# Patient Record
Sex: Male | Born: 1975 | Race: White | Hispanic: No | Marital: Married | Smoking: Never smoker
Health system: Southern US, Community
[De-identification: ages and names within clinical notes are randomized; demographics above are authoritative.]

## PROBLEM LIST (undated history)

## (undated) DIAGNOSIS — E119 Type 2 diabetes mellitus without complications: Secondary | ICD-10-CM

## (undated) DIAGNOSIS — H539 Unspecified visual disturbance: Secondary | ICD-10-CM

## (undated) DIAGNOSIS — L600 Ingrowing nail: Secondary | ICD-10-CM

## (undated) HISTORY — DX: Ingrowing nail: L60.0

## (undated) HISTORY — DX: Unspecified visual disturbance: H53.9

## (undated) HISTORY — DX: Type 2 diabetes mellitus without complications: E11.9

---
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
## 1997-07-26 HISTORY — PX: NASAL SEPTUM SURGERY: SHX37

## 2015-10-25 DIAGNOSIS — H539 Unspecified visual disturbance: Secondary | ICD-10-CM

## 2015-10-25 HISTORY — DX: Unspecified visual disturbance: H53.9

## 2015-12-02 ENCOUNTER — Ambulatory Visit (INDEPENDENT_AMBULATORY_CARE_PROVIDER_SITE_OTHER): Payer: Managed Care, Other (non HMO) | Admitting: Family Medicine

## 2015-12-02 ENCOUNTER — Telehealth: Payer: Self-pay | Admitting: Family Medicine

## 2015-12-02 ENCOUNTER — Encounter: Payer: Self-pay | Admitting: Family Medicine

## 2015-12-02 VITALS — BP 114/76 | HR 71 | Temp 98.5°F | Ht 71.0 in | Wt 236.1 lb

## 2015-12-02 DIAGNOSIS — H539 Unspecified visual disturbance: Secondary | ICD-10-CM | POA: Diagnosis not present

## 2015-12-02 DIAGNOSIS — R358 Other polyuria: Secondary | ICD-10-CM | POA: Diagnosis not present

## 2015-12-02 DIAGNOSIS — R3589 Other polyuria: Secondary | ICD-10-CM

## 2015-12-02 DIAGNOSIS — Z7189 Other specified counseling: Secondary | ICD-10-CM | POA: Diagnosis not present

## 2015-12-02 DIAGNOSIS — N529 Male erectile dysfunction, unspecified: Secondary | ICD-10-CM | POA: Insufficient documentation

## 2015-12-02 DIAGNOSIS — Z0000 Encounter for general adult medical examination without abnormal findings: Secondary | ICD-10-CM | POA: Diagnosis not present

## 2015-12-02 DIAGNOSIS — Z7689 Persons encountering health services in other specified circumstances: Secondary | ICD-10-CM

## 2015-12-02 DIAGNOSIS — R208 Other disturbances of skin sensation: Secondary | ICD-10-CM

## 2015-12-02 DIAGNOSIS — N528 Other male erectile dysfunction: Secondary | ICD-10-CM

## 2015-12-02 DIAGNOSIS — R200 Anesthesia of skin: Secondary | ICD-10-CM | POA: Insufficient documentation

## 2015-12-02 DIAGNOSIS — R631 Polydipsia: Secondary | ICD-10-CM | POA: Insufficient documentation

## 2015-12-02 HISTORY — DX: Male erectile dysfunction, unspecified: N52.9

## 2015-12-02 LAB — CBC WITH DIFFERENTIAL/PLATELET
BASOS PCT: 1.5 % (ref 0.0–3.0)
Basophils Absolute: 0.1 10*3/uL (ref 0.0–0.1)
EOS ABS: 0.2 10*3/uL (ref 0.0–0.7)
EOS PCT: 2.3 % (ref 0.0–5.0)
HEMATOCRIT: 46.8 % (ref 39.0–52.0)
Hemoglobin: 16 g/dL (ref 13.0–17.0)
Lymphocytes Relative: 29.6 % (ref 12.0–46.0)
Lymphs Abs: 2.1 10*3/uL (ref 0.7–4.0)
MCHC: 34.3 g/dL (ref 30.0–36.0)
MCV: 84.8 fl (ref 78.0–100.0)
MONO ABS: 0.5 10*3/uL (ref 0.1–1.0)
Monocytes Relative: 7 % (ref 3.0–12.0)
NEUTROS ABS: 4.3 10*3/uL (ref 1.4–7.7)
Neutrophils Relative %: 59.6 % (ref 43.0–77.0)
PLATELETS: 203 10*3/uL (ref 150.0–400.0)
RBC: 5.51 Mil/uL (ref 4.22–5.81)
RDW: 13.2 % (ref 11.5–15.5)
WBC: 7.2 10*3/uL (ref 4.0–10.5)

## 2015-12-02 LAB — COMPREHENSIVE METABOLIC PANEL
ALBUMIN: 4.6 g/dL (ref 3.5–5.2)
ALT: 23 U/L (ref 0–53)
AST: 14 U/L (ref 0–37)
Alkaline Phosphatase: 48 U/L (ref 39–117)
BUN: 12 mg/dL (ref 6–23)
CALCIUM: 9.8 mg/dL (ref 8.4–10.5)
CHLORIDE: 102 meq/L (ref 96–112)
CO2: 26 meq/L (ref 19–32)
CREATININE: 0.86 mg/dL (ref 0.40–1.50)
GFR: 103.78 mL/min (ref >60.00)
Glucose, Bld: 309 mg/dL — ABNORMAL HIGH (ref 70–99)
POTASSIUM: 4.8 meq/L (ref 3.5–5.1)
Sodium: 137 mEq/L (ref 135–145)
Total Bilirubin: 0.8 mg/dL (ref 0.2–1.2)
Total Protein: 7.4 g/dL (ref 6.0–8.3)

## 2015-12-02 LAB — HEMOGLOBIN A1C: HEMOGLOBIN A1C: 11 % — AB (ref 4.6–6.5)

## 2015-12-02 NOTE — Patient Instructions (Addendum)
Diabetic Retinopathy Diabetic retinopathy is a disease of the light-sensitive membrane at the back of the eye (retina). It is a complication of diabetes and a common cause of blindness. Early detection of the disease is key to keeping your eyes healthy.  CAUSES  Diabetic retinopathy is caused by blood sugar (glucose) levels that are too high over an extended period of time. High blood sugars cause damage to the small blood vessels of the retina, allowing blood to leak through the vessel walls. This causes visual impairment and eventually blindness. RISK FACTORS  High blood pressure.  Having diabetes for a long time.  Having poorly controlled blood sugars. SIGNS AND SYMPTOMS  In the early stages of diabetic retinopathy, there are often no symptoms. As the condition advances, symptoms may include:  Blurred vision. This is usually caused by a swelling due to abnormal blood glucose levels. The blurriness may go away when blood glucose levels return to normal.  Moving specks or dark spots (floaters) in your vision. These can be caused by a small retinal hemorrhage. A hemorrhage is bleeding from blood vessels.  Missing parts of your field of vision, such as things at the side. This can be caused by larger retinal hemorrhages.  Difficulty reading books or signs.  Double vision.  Pain in one or both eyes.  Feeling pressure in one or both eyes.  Trouble seeing straight lines. Straight lines do not look straight.  Redness of the eyes that does not go away. DIAGNOSIS  Your eye care specialist can detect changes in the blood vessels of your eye by putting drops in your eyes that enlarge (dilate) your pupils. This allows your eye care specialist to get a good look at your retina to see if there are any changes that have occurred as a result of your diabetes. You should have your eyes examined once a year. TREATMENT  Your eye care specialist may use a special laser beam to seal the blood vessels  of the retina and stop them from leaking. Early detection and treatment are important so that further damage to your eyes can be prevented. In addition, managing your blood sugars and keeping them in the target range can slow the progress of the disease. HOME CARE INSTRUCTIONS   Keep your blood pressure within your target range.  Keep your blood glucose levels within your target range.  Follow your health care provider's instructions regarding diet and other means for controlling your blood glucose levels.  Check your blood levels for glucose as recommended by your health care provider.  Keep regular appointments with your eye specialist. An eye specialist can usually see diabetic retinopathy developing long before it starts causing problems. In many cases, it can be treated to prevent complications from occurring. If you have diabetes, you should have your eyes checked at least every year. Your risk of retinopathy increases the longer you have the disease.  If you smoke, quit. Ask your health care provider for help if needed. Smoking can make retinopathy worse. SEEK MEDICAL CARE IF:   You notice gradual blurring or other changes in your vision over time.  You notice that your glasses or contact lenses do not make things look as sharp as they once did.  You have trouble reading or seeing details at a distance with either eye.  You notice a sudden change in your vision or notice that parts of your field of vision appear missing or hazy.  You suddenly see moving specks or dark spots   in the field of vision of either eye.  You have sudden partial loss of vision in either eye.   This information is not intended to replace advice given to you by your health care provider. Make sure you discuss any questions you have with your health care provider.   Document Released: 07/09/2000 Document Revised: 05/02/2013 Document Reviewed: 01/01/2013 Elsevier Interactive Patient Education [employer]2016 [employer]  We will call you with lab results.  Depending on results is when we will need to see you.  Physical with fasting labs prior, within 3 months.

## 2015-12-02 NOTE — Progress Notes (Signed)
Patient ID: Blake Davis, male   DOB: [birth year], 40 y.o.   MRN: [id number]      Patient ID: Blake Davis, male  DOB: [birth year], 40 y.o.   MRN: [id number]  Subjective:  Blake Davis is a 40 y.o. male present for establishment of care. All past medical history, surgical history, allergies, family history, immunizations, medications and social history were obtained and entered in the electronic medical record today. All recent labs, ED visits and hospitalizations within the last year were reviewed.  Patient was seen by his ophthalmologist in April and was told he needs to be screened for diabetes. He was initially seen at the ophthalmologist for blurry vision. He states the ophthalmologist saw spots on the back of his eyes that could be from diabetes. Patient has not been followed routinely by primary care physician in the past. He states that the blurry vision had been worsening over the last 6 months. He also endorses polydipsia, polyuria, erectile dysfunction, "numbness" in bilateral feet, and a nonhealing "wound" on his right thumb. All of the above symptoms have been present between 6 months and 1 year.  Health maintenance:  Colonoscopy: No family history, routine screening at age 18 Immunizations: All immunizations will need up-to-date. Infectious disease screening: No known screenings. PSA: Never, prostate cancer in paternal grandfather and a "late age". Assistive device: None Oxygen KGM:WNUU Patient has a Dental home. Hospitalizations/ED visits: None Ophthalmologist: Dr. Manon Davis, [facility]  Past Medical History  Diagnosis Date  . Visual changes 10/2015    Diabetic changes   No Known Allergies Past Surgical History  Procedure Laterality Date  . Nasal septum surgery  1999    deviated  . Foot surgery  1988    mole/nevus   Family History  Problem Relation Age of Onset  . Diabetes Mother   . Heart disease Mother   . Heart attack Mother   .  Alcohol abuse Paternal Uncle   . Dementia Paternal Grandmother   . Diabetes Paternal Grandfather   . Stroke Paternal Grandfather   . Cancer Paternal Grandfather   . Prostate cancer Paternal Grandfather    Social History   Social History  . Marital Status: Married    Spouse Name: Blake Davis  . Number of Children: 0  . Years of Education: BA   Occupational History  . Independent Contractor Other    [employer]   Social History Main Topics  . Smoking status: Never Smoker   . Smokeless tobacco: Never Used  . Alcohol Use: No  . Drug Use: No  . Sexual Activity: Yes    Birth Control/ Protection: None     Comment: male partner   Other Topics Concern  . Not on file   Social History Narrative   Patient lives at home with wife (Mel). No children.   Has two pets.    Bachelors degree, works as a Visual merchandiser   Drinks caffeinated beverages   Wears his seatbelt   Smoke detector in home   Feels safe in his relationships     ROS: Negative, with the exception of above mentioned in HPI  Objective: BP 114/76 mmHg  Pulse 71  Temp(Src) 98.5 F (36.9 C) (Oral)  Ht  (1.803 m)  Wt 236 lb 1.9 oz (107.103 kg)  BMI 32.95 kg/m2  SpO2 98% Gen: Afebrile. No acute distress. Nontoxic in appearance, well-developed, well-nourished, male, pleasant HENT: AT. [place]. Left TM visualized and normal in appearance, normal external  auditory canal. Right tympanic membrane unable to be visualized secondary to cerumen impaction. MMM, no oral lesions Eyes:Pupils Equal Round Reactive to light, Extraocular movements intact,  Conjunctiva without redness, discharge or icterus. Neck/lymp/endocrine: Supple, no lymphadenopathy, no thyromegaly CV: RRR no murmur appreciated, no edema, +2/4 P posterior tibialis pulses Chest: CTAB, no wheeze, rhonchi or crackles.  Abd: Soft. Round. NTND. BS present. No Masses palpated. Skin: Right thumb with scaly skin patch, no erythema, no  drainage. Neuro/Msk:  Normal gait. PERLA. EOMi. Alert. Oriented x3.   Psych: Normal affect, dress and demeanor. Normal speech. Normal thought content and judgment.   Assessment/plan: Blake Davis is a 40 y.o. male present for establishment of care, with signs and symptoms of new-onset diabetes. Visual changes/erectile dysfunction/polyuria/polydipsia/numbness in feet - Patient likely with diabetes. Discussed changes in small vessels with diabetes can cause many of the symptoms that he is experiencing currently. Discussed with him if workup is positive we'll need to see him back soon in order to start management. If workup is negative, will see him back at his convenience to manage above symptoms. - CBC w/Diff - Comprehensive metabolic panel - HgB A1c  Healthcare maintenance Colonoscopy: No family history, routine screening at age 40 Immunizations: All immunizations will need up-to-date. Infectious disease screening: No known screenings. PSA: Never, prostate cancer in paternal grandfather and a "late age".  Follow-up depending upon lab results. Patient encouraged that if he does prove to be a diabetic will need to see back very quickly. Otherwise patient can schedule management of symptoms as desired, or physical within 3 months.  Greater than 45 minutes was spent with patient, greater than 50% of that time was spent face-to-face with patient counseling and coordinating care.   Electronically signed by: Felix Pacinienee Kuneff, DO [facility]

## 2015-12-02 NOTE — Telephone Encounter (Signed)
Patient is going out of town. Please call his wife's cell [phone] when you have his blood results.

## 2015-12-02 NOTE — Telephone Encounter (Signed)
Patient's lab work today indicate he is a new diabetic with an A1c of 11%. Discussed this over the phone with him and his wife Mel. Strongly encouraged him to follow-up immediately for medication start and diabetes education. Patient is going out of town Thursday afternoon, would like to wait until next week to start regimen. Discussed with him diabetes management needs to start as soon as possible with and A1c of this caliber. Patient amendable to come in Thursday morning before leaving for vacation. Patient was scheduled for Thursday, May 11 at 8:15 in a 30 minute slot, by this provider,  for diabetes education and start of oral medications. Referral will also be placed to diabetes formal education classes at that appointment, which he can set up at his convenience.

## 2015-12-04 ENCOUNTER — Encounter: Payer: Self-pay | Admitting: Family Medicine

## 2015-12-04 ENCOUNTER — Ambulatory Visit (INDEPENDENT_AMBULATORY_CARE_PROVIDER_SITE_OTHER): Payer: Managed Care, Other (non HMO) | Admitting: Family Medicine

## 2015-12-04 VITALS — BP 119/78 | HR 66 | Temp 98.0°F | Resp 20 | Ht 71.0 in | Wt 236.5 lb

## 2015-12-04 DIAGNOSIS — Z6833 Body mass index (BMI) 33.0-33.9, adult: Secondary | ICD-10-CM

## 2015-12-04 DIAGNOSIS — E119 Type 2 diabetes mellitus without complications: Secondary | ICD-10-CM

## 2015-12-04 DIAGNOSIS — E083211 Diabetes mellitus due to underlying condition with mild nonproliferative diabetic retinopathy with macular edema, right eye: Secondary | ICD-10-CM | POA: Insufficient documentation

## 2015-12-04 DIAGNOSIS — E083292 Diabetes mellitus due to underlying condition with mild nonproliferative diabetic retinopathy without macular edema, left eye: Secondary | ICD-10-CM

## 2015-12-04 DIAGNOSIS — Z6835 Body mass index (BMI) 35.0-35.9, adult: Secondary | ICD-10-CM | POA: Insufficient documentation

## 2015-12-04 DIAGNOSIS — B369 Superficial mycosis, unspecified: Secondary | ICD-10-CM

## 2015-12-04 LAB — MICROALBUMIN / CREATININE URINE RATIO
CREATININE, U: 157 mg/dL
MICROALB/CREAT RATIO: 0.6 mg/g (ref 0.0–30.0)
Microalb, Ur: 1 mg/dL (ref 0.0–1.9)

## 2015-12-04 MED ORDER — CLOTRIMAZOLE 1 % EX CREA: 1.0000 "application " | TOPICAL_CREAM | Freq: Two times a day (BID) | CUTANEOUS | Status: DC

## 2015-12-04 MED ORDER — METFORMIN HCL 500 MG PO TABS: ORAL_TABLET | ORAL | Status: DC

## 2015-12-04 MED ORDER — BLOOD GLUCOSE METER KIT: PACK | Status: DC

## 2015-12-04 NOTE — Patient Instructions (Signed)
Basic Carbohydrate Counting for Diabetes Mellitus Carbohydrate counting is a method for keeping track of the amount of carbohydrates you eat. Eating carbohydrates naturally increases the level of sugar (glucose) in your blood, so it is important for you to know the amount that is okay for you to have in every meal. Carbohydrate counting helps keep the level of glucose in your blood within normal limits. The amount of carbohydrates allowed is different for every person. A dietitian can help you calculate the amount that is right for you. Once you know the amount of carbohydrates you can have, you can count the carbohydrates in the foods you want to eat. Carbohydrates are found in the following foods:  Grains, such as breads and cereals.  Dried beans and soy products.  Starchy vegetables, such as potatoes, peas, and corn.  Fruit and fruit juices.  Milk and yogurt.  Sweets and snack foods, such as cake, cookies, candy, chips, soft drinks, and fruit drinks. CARBOHYDRATE COUNTING There are two ways to count the carbohydrates in your food. You can use either of the methods or a combination of both. Reading the "Nutrition Facts" on Packaged Food The "Nutrition Facts" is an area that is included on the labels of almost all packaged food and beverages in the [place]. It includes the serving size of that food or beverage and information about the nutrients in each serving of the food, including the grams (g) of carbohydrate per serving.  Decide the number of servings of this food or beverage that you will be able to eat or drink. Multiply that number of servings by the number of grams of carbohydrate that is listed on the label for that serving. The total will be the amount of carbohydrates you will be having when you eat or drink this food or beverage. Learning Standard Serving Sizes of Food When you eat food that is not packaged or does not include "Nutrition Facts" on the label, you need to  measure the servings in order to count the amount of carbohydrates.A serving of most carbohydrate-rich foods contains about 15 g of carbohydrates. The following list includes serving sizes of carbohydrate-rich foods that provide 15 g ofcarbohydrate per serving:   1 slice of bread (1 oz) or 1 six-inch tortilla.    of a hamburger bun or English muffin.  4-6 crackers.   cup unsweetened dry cereal.    cup hot cereal.   cup rice or pasta.    cup mashed potatoes or  of a large baked potato.  1 cup fresh fruit or one small piece of fruit.    cup canned or frozen fruit or fruit juice.  1 cup milk.   cup plain fat-free yogurt or yogurt sweetened with artificial sweeteners.   cup cooked dried beans or starchy vegetable, such as peas, corn, or potatoes.  Decide the number of standard-size servings that you will eat. Multiply that number of servings by 15 (the grams of carbohydrates in that serving). For example, if you eat 2 cups of strawberries, you will have eaten 2 servings and 30 g of carbohydrates (2 servings x 15 g = 30 g). For foods such as soups and casseroles, in which more than one food is mixed in, you will need to count the carbohydrates in each food that is included. EXAMPLE OF CARBOHYDRATE COUNTING Sample Dinner  3 oz chicken breast.   cup of brown rice.   cup of corn.  1 cup milk.   1 cup strawberries with   sugar-free whipped topping.  Carbohydrate Calculation Step 1: Identify the foods that contain carbohydrates:   Rice.   Corn.   Milk.   Strawberries. Step 2:Calculate the number of servings eaten of each:   2 servings of rice.   1 serving of corn.   1 serving of milk.   1 serving of strawberries. Step 3: Multiply each of those number of servings by 15 g:   2 servings of rice x 15 g = 30 g.   1 serving of corn x 15 g = 15 g.   1 serving of milk x 15 g = 15 g.   1 serving of strawberries x 15 g = 15 g. Step 4: Add  together all of the amounts to find the total grams of carbohydrates eaten: 30 g + 15 g + 15 g + 15 g = 75 g.   This information is not intended to replace advice given to you by your health care provider. Make sure you discuss any questions you have with your health care provider.   Document Released: 07/12/2005 Document Revised: 08/02/2014 Document Reviewed: 06/08/2013 Elsevier Interactive Patient Education 2016 [employer] Inc[employer] Diabetes and Exercise Exercising regularly is important. It is not just about losing weight. It has many health benefits, such as:  Improving your overall fitness, flexibility, and endurance.  Increasing your bone density.  Helping with weight control.  Decreasing your body fat.  Increasing your muscle strength.  Reducing stress and tension.  Improving your overall health. People with diabetes who exercise gain additional benefits because exercise:  Reduces appetite.  Improves the body's use of blood sugar (glucose).  Helps lower or control blood glucose.  Decreases blood pressure.  Helps control blood lipids (such as cholesterol and triglycerides).  Improves the body's use of the hormone insulin by:  Increasing the body's insulin sensitivity.  Reducing the body's insulin needs.  Decreases the risk for heart disease because exercising:  Lowers cholesterol and triglycerides levels.  Increases the levels of good cholesterol (such as high-density lipoproteins [HDL]) in the body.  Lowers blood glucose levels. YOUR ACTIVITY PLAN  Choose an activity that you enjoy, and set realistic goals. To exercise safely, you should begin practicing any new physical activity slowly, and gradually increase the intensity of the exercise over time. Your health care provider or diabetes educator can help create an activity plan that works for you. General recommendations include:  Encouraging children to engage in at least 60 minutes of physical activity each  day.  Stretching and performing strength training exercises, such as yoga or weight lifting, at least 2 times per week.  Performing a total of at least 150 minutes of moderate-intensity exercise each week, such as brisk walking or water aerobics.  Exercising at least 3 days per week, making sure you allow no more than 2 consecutive days to pass without exercising.  Avoiding long periods of inactivity (90 minutes or more). When you have to spend an extended period of time sitting down, take frequent breaks to walk or stretch. RECOMMENDATIONS FOR EXERCISING WITH TYPE 1 OR TYPE 2 DIABETES   Check your blood glucose before exercising. If blood glucose levels are greater than 240 mg/dL, check for urine ketones. Do not exercise if ketones are present.  Avoid injecting insulin into areas of the body that are going to be exercised. For example, avoid injecting insulin into:  The arms when playing tennis.  The legs when jogging.  Keep a record of:  Food intake before   intake before and after you exercise.  Expected peak times of insulin action.  Blood glucose levels before and after you exercise.  The type and amount of exercise you have done.  Review your records with your health care provider. Your health care provider will help you to develop guidelines for adjusting food intake and insulin amounts before and after exercising.  If you take insulin or oral hypoglycemic agents, watch for signs and symptoms of hypoglycemia. They include:  Dizziness.  Shaking.  Sweating.  Chills.  Confusion.  Drink plenty of water while you exercise to prevent dehydration or heat stroke. Body water is lost during exercise and must be replaced.  Talk to your health care provider before starting an exercise program to make sure it is safe for you. Remember, almost any type of activity is better than none.   This information is not intended to replace advice given to you by your health care provider. Make sure you  discuss any questions you have with your health care provider.   Document Released: 10/02/2003 Document Revised: 11/26/2014 Document Reviewed: 12/19/2012 Elsevier Interactive Patient Education 2016 [employer] Inc.  Diabetes and Foot Care Diabetes may cause you to have problems because of poor blood supply (circulation) to your feet and legs. This may cause the skin on your feet to become thinner, break easier, and heal more slowly. Your skin may become dry, and the skin may peel and crack. You may also have nerve damage in your legs and feet causing decreased feeling in them. You may not notice minor injuries to your feet that could lead to infections or more serious problems. Taking care of your feet is one of the most important things you can do for yourself.  HOME CARE INSTRUCTIONS  Wear shoes at all times, even in the house. Do not go barefoot. Bare feet are easily injured.  Check your feet daily for blisters, cuts, and redness. If you cannot see the bottom of your feet, use a mirror or ask someone for help.  Wash your feet with warm water (do not use hot water) and mild soap. Then pat your feet and the areas between your toes until they are completely dry. Do not soak your feet as this can dry your skin.  Apply a moisturizing lotion or petroleum jelly (that does not contain alcohol and is unscented) to the skin on your feet and to dry, brittle toenails. Do not apply lotion between your toes.  Trim your toenails straight across. Do not dig under them or around the cuticle. File the edges of your nails with an emery board or nail file.  Do not cut corns or calluses or try to remove them with medicine.  Wear clean socks or stockings every day. Make sure they are not too tight. Do not wear knee-high stockings since they may decrease blood flow to your legs.  Wear shoes that fit properly and have enough cushioning. To break in new shoes, wear them for just a few hours a day. This prevents you  from injuring your feet. Always look in your shoes before you put them on to be sure there are no objects inside.  Do not cross your legs. This may decrease the blood flow to your feet.  If you find a minor scrape, cut, or break in the skin on your feet, keep it and the skin around it clean and dry. These areas may be cleansed with mild soap and water. Do not cleanse the area with  peroxide, alcohol, or iodine.  When you remove an adhesive bandage, be sure not to damage the skin around it.  If you have a wound, look at it several times a day to make sure it is healing.  Do not use heating pads or hot water bottles. They may burn your skin. If you have lost feeling in your feet or legs, you may not know it is happening until it is too late.  Make sure your health care provider performs a complete foot exam at least annually or more often if you have foot problems. Report any cuts, sores, or bruises to your health care provider immediately. SEEK MEDICAL CARE IF:   You have an injury that is not healing.  You have cuts or breaks in the skin.  You have an ingrown nail.  You notice redness on your legs or feet.  You feel burning or tingling in your legs or feet.  You have pain or cramps in your legs and feet.  Your legs or feet are numb.  Your feet always feel cold. SEEK IMMEDIATE MEDICAL CARE IF:   There is increasing redness, swelling, or pain in or around a wound.  There is a red line that goes up your leg.  Pus is coming from a wound.  You develop a fever or as directed by your health care provider.  You notice a bad smell coming from an ulcer or wound.   This information is not intended to replace advice given to you by your health care provider. Make sure you discuss any questions you have with your health care provider.   Document Released: 07/09/2000 Document Revised: 03/14/2013 Document Reviewed: 12/19/2012 Elsevier Interactive Patient Education 2016 [employer]  Diabetes Mellitus and Food It is important for you to manage your blood sugar (glucose) level. Your blood glucose level can be greatly affected by what you eat. Eating healthier foods in the appropriate amounts throughout the day at about the same time each day will help you control your blood glucose level. It can also help slow or prevent worsening of your diabetes mellitus. Healthy eating may even help you improve the level of your blood pressure and reach or maintain a healthy weight.  General recommendations for healthful eating and cooking habits include:  Eating meals and snacks regularly. Avoid going long periods of time without eating to lose weight.  Eating a diet that consists mainly of plant-based foods, such as fruits, vegetables, nuts, legumes, and whole grains.  Using low-heat cooking methods, such as baking, instead of high-heat cooking methods, such as deep frying. Work with your dietitian to make sure you understand how to use the Nutrition Facts information on food labels. HOW CAN FOOD AFFECT ME? Carbohydrates Carbohydrates affect your blood glucose level more than any other type of food. Your dietitian will help you determine how many carbohydrates to eat at each meal and teach you how to count carbohydrates. Counting carbohydrates is important to keep your blood glucose at a healthy level, especially if you are using insulin or taking certain medicines for diabetes mellitus. Alcohol Alcohol can cause sudden decreases in blood glucose (hypoglycemia), especially if you use insulin or take certain medicines for diabetes mellitus. Hypoglycemia can be a life-threatening condition. Symptoms of hypoglycemia (sleepiness, dizziness, and disorientation) are similar to symptoms of having too much alcohol.  If your health care provider has given you approval to drink alcohol, do so in moderation and use the following guidelines:  Women should not  have more than one drink per day,  and men should not have more than two drinks per day. One drink is equal to:  12 oz of beer.  5 oz of wine.  1 oz of hard liquor.  Do not drink on an empty stomach.  Keep yourself hydrated. Have water, diet soda, or unsweetened iced tea.  Regular soda, juice, and other mixers might contain a lot of carbohydrates and should be counted. WHAT FOODS ARE NOT RECOMMENDED? As you make food choices, it is important to remember that all foods are not the same. Some foods have fewer nutrients per serving than other foods, even though they might have the same number of calories or carbohydrates. It is difficult to get your body what it needs when you eat foods with fewer nutrients. Examples of foods that you should avoid that are high in calories and carbohydrates but low in nutrients include:  Trans fats (most processed foods list trans fats on the Nutrition Facts label).  Regular soda.  Juice.  Candy.  Sweets, such as cake, pie, doughnuts, and cookies.  Fried foods. WHAT FOODS CAN I EAT? Eat nutrient-rich foods, which will nourish your body and keep you healthy. The food you should eat also will depend on several factors, including:  The calories you need.  The medicines you take.  Your weight.  Your blood glucose level.  Your blood pressure level.  Your cholesterol level. You should eat a variety of foods, including:  Protein.  Lean cuts of meat.  Proteins low in saturated fats, such as fish, egg whites, and beans. Avoid processed meats.  Fruits and vegetables.  Fruits and vegetables that may help control blood glucose levels, such as apples, mangoes, and yams.  Dairy products.  Choose fat-free or low-fat dairy products, such as milk, yogurt, and cheese.  Grains, bread, pasta, and rice.  Choose whole grain products, such as multigrain bread, whole oats, and brown rice. These foods may help control blood pressure.  Fats.  Foods containing healthful fats, such as  nuts, avocado, olive oil, canola oil, and fish. DOES EVERYONE WITH DIABETES MELLITUS HAVE THE SAME MEAL PLAN? Because every person with diabetes mellitus is different, there is not one meal plan that works for everyone. It is very important that you meet with a dietitian who will help you create a meal plan that is just right for you.   This information is not intended to replace advice given to you by your health care provider. Make sure you discuss any questions you have with your health care provider.   Document Released: 04/08/2005 Document Revised: 08/02/2014 Document Reviewed: 06/08/2013 Elsevier Interactive Patient Education [employer]  Followup in 1 month on diabetes. Referral to diabetes education placed today.  Start getting use to checking your blood sugars fasting every morning (before eat/drink) and records in a book.. Start Metformin taper.  Antifungal called into pharmacy for your finger, along with test strips for you one touch.

## 2015-12-04 NOTE — Progress Notes (Signed)
Patient ID: Blake Davis, male   DOB: 1976/04/20, 40 y.o.   MRN: [id number]    Blake Davis , 09-23-1975, 40 y.o., male MRN: [id number]  CC: New-onset diabetes type 2 Subjective: Patient presents for follow-up today secondary to abnormal laboratory results. Patient was initially seen for new patient establishment with complaints of erectile dysfunction, visual changes, polydipsia and polyuria. Lab tests were completed which showed an A1c of greater than 11. Patient was seen by his ophthalmologist and does have some diabetic retinopathy changes. Patient was brought in today to counsel on medication start and diabetes education. His fasting glucose was 309, A1c greater than 11%, otherwise all other CBC CMP values were within normal range. Patient has a family history of diabetes. He comes in to the appointment today with his wife.  Recent Results (from the past 2160 hour(s))  CBC w/Diff     Status: None   Collection Time: 12/02/15 10:45 AM  Result Value Ref Range   WBC 7.2 4.0 - 10.5 K/uL   RBC 5.51 4.22 - 5.81 Mil/uL   Hemoglobin 16.0 13.0 - 17.0 g/dL   HCT 46.8 39.0 - 52.0 %   MCV 84.8 78.0 - 100.0 fl   MCHC 34.3 30.0 - 36.0 g/dL   RDW 13.2 11.5 - 15.5 %   Platelets 203.0 150.0 - 400.0 K/uL   Neutrophils Relative % 59.6 43.0 - 77.0 %   Lymphocytes Relative 29.6 12.0 - 46.0 %   Monocytes Relative 7.0 3.0 - 12.0 %   Eosinophils Relative 2.3 0.0 - 5.0 %   Basophils Relative 1.5 0.0 - 3.0 %   Neutro Abs 4.3 1.4 - 7.7 K/uL   Lymphs Abs 2.1 0.7 - 4.0 K/uL   Monocytes Absolute 0.5 0.1 - 1.0 K/uL   Eosinophils Absolute 0.2 0.0 - 0.7 K/uL   Basophils Absolute 0.1 0.0 - 0.1 K/uL  Comprehensive metabolic panel     Status: Abnormal   Collection Time: 12/02/15 10:45 AM  Result Value Ref Range   Sodium 137 135 - 145 mEq/L   Potassium 4.8 3.5 - 5.1 mEq/L   Chloride 102 96 - 112 mEq/L   CO2 26 19 - 32 mEq/L   Glucose, Bld 309 (H) 70 - 99 mg/dL   BUN 12 6 - 23 mg/dL   Creatinine, Ser 0.86  0.40 - 1.50 mg/dL   Total Bilirubin 0.8 0.2 - 1.2 mg/dL   Alkaline Phosphatase 48 39 - 117 U/L   AST 14 0 - 37 U/L   ALT 23 0 - 53 U/L   Total Protein 7.4 6.0 - 8.3 g/dL   Albumin 4.6 3.5 - 5.2 g/dL   Calcium 9.8 8.4 - 10.5 mg/dL   GFR 103.78 >60.00 mL/min  HgB A1c     Status: Abnormal   Collection Time: 12/02/15 10:45 AM  Result Value Ref Range   Hgb A1c MFr Bld 11.0 (H) 4.6 - 6.5 %    Comment: Glycemic Control Guidelines for People with Diabetes:Non Diabetic:  <6%Goal of Therapy: <7%Additional Action Suggested:  >8%       No Known Allergies Social History  Substance Use Topics  . Smoking status: Never Smoker   . Smokeless tobacco: Never Used  . Alcohol Use: No   Past Medical History  Diagnosis Date  . Visual changes 10/2015    Diabetic changes   Past Surgical History  Procedure Laterality Date  . Nasal septum surgery  1999    deviated  . Foot surgery  1988  mole/nevus   Family History  Problem Relation Age of Onset  . Diabetes Mother   . Heart disease Mother   . Heart attack Mother   . Alcohol abuse Paternal Uncle   . Dementia Paternal Grandmother   . Diabetes Paternal Grandfather   . Stroke Paternal Grandfather   . Cancer Paternal Grandfather   . Prostate cancer Paternal Grandfather      Medication List    Notice  As of 12/04/2015  8:25 AM   You have not been prescribed any medications.       ROS: Negative, with the exception of above mentioned in HPI   Objective:  BP 119/78 mmHg  Pulse 66  Temp(Src) 98 F (36.7 C) (Oral)  Resp 20  Ht '5\' 11"'$  (1.803 m)  Wt 236 lb 8 oz (107.276 kg)  BMI 33.00 kg/m2  SpO2 95% Body mass index is 33 kg/(m^2). Gen: Afebrile. No acute distress. Nontoxic appearance, well-developed, well-nourished, Caucasian male, obese. HENT: AT. [place]. MMM, no oral lesions.  Eyes:Pupils Equal Round Reactive to light, Extraocular movements intact,  Conjunctiva without redness, discharge or icterus. Skin: Right thumb with nonhealing  scaly rash. No erythema, no skin drainage. Neuro: Normal gait. PERLA. EOMi. Alert. Oriented x3   Assessment/Plan: Blake Davis is a 40 y.o. 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 AVS information was also provided on all the above. Patient was asked to buy a note book to record his daily fasting blood sugars. He can also check 1-2 additional times a day just to get a feel for taking his blood sugars and how food affects his blood sugar. He is to cut back on sugar and carbohydrates, increase his exercise 250 minutes a week. We will start an metformin taper that will eventually give him 1000 mg twice a day metformin. Discussed potential side effects of metformin. Discussed with patient that with elevated A1c of 11 he may end up needing a two oral regimen versus insulin. Skin and exercise will lower his risk of needing more medication or insulin. Patient was supplied with a free glucose monitor today with refills on test strips and lancets. Discussed diabetic changes to small vessels. Discussed neuropathy and erectile dysfunction cannot be side effects of uncontrolled diabetes. - blood glucose meter kit and supplies; Dispense based on patient and insurance preference.  Use up to three times daily as directed. (E11.9) I  Dispense: 1 each; Refill: 0 - Urine Microalbumin w/creat. ratio - metFORMIN (GLUCOPHAGE) 500 MG tablet; 500 mg BID with food for 7 days, then 1000 mg BID with food  Dispense: 126 tablet; Refill: 0 - Patient will need to follow with  ophthalmology at least yearly, possibly every 6 months initially secondary to early diabetic changes observed. He is established with an ophthalmologist. - Patient will need every 6 months to year foot exam. - Patient will need a follow-up in 4 weeks, and then every 3 months. - Referral to diabetes education.  Fungal skin infection - clotrimazole (LOTRIMIN) 1 % cream; Apply 1 application topically 2 (two) times daily.  Dispense: 30 g; Refill: 0   > 25 minutes spent with patient, >50%  of time spent face to face counseling patient and coordinating care.  electronically signed by:  Howard Pouch, DO  [place]

## 2015-12-05 ENCOUNTER — Telehealth: Payer: Self-pay | Admitting: Family Medicine

## 2015-12-05 NOTE — Telephone Encounter (Signed)
Patient: His urinalysis was normal. No signs of kidney damage secondary to diabetes.

## 2015-12-05 NOTE — Telephone Encounter (Signed)
Left message with results on patient voice mail 

## 2016-01-13 ENCOUNTER — Ambulatory Visit (INDEPENDENT_AMBULATORY_CARE_PROVIDER_SITE_OTHER): Payer: Managed Care, Other (non HMO) | Admitting: Family Medicine

## 2016-01-13 ENCOUNTER — Encounter: Payer: Self-pay | Admitting: Family Medicine

## 2016-01-13 VITALS — BP 130/81 | HR 60 | Temp 98.4°F | Resp 20 | Ht 71.0 in | Wt 237.0 lb

## 2016-01-13 DIAGNOSIS — E083292 Diabetes mellitus due to underlying condition with mild nonproliferative diabetic retinopathy without macular edema, left eye: Secondary | ICD-10-CM

## 2016-01-13 DIAGNOSIS — E119 Type 2 diabetes mellitus without complications: Secondary | ICD-10-CM

## 2016-01-13 LAB — GLUCOSE, POCT (MANUAL RESULT ENTRY): POC GLUCOSE: 171 mg/dL — AB (ref 70–99)

## 2016-01-13 MED ORDER — METFORMIN HCL 1000 MG PO TABS: ORAL_TABLET | ORAL | Status: DC

## 2016-01-13 NOTE — Progress Notes (Signed)
Patient ID: Blake Davis, male   DOB: 12/16/1975, 40 y.o.   MRN: [id number]    Blake Davis , 10/02/1975, 40 y.o., male MRN: [id number]  CC: New-onset diabetes type 2 Subjective:  Diabetes type 2 with retinopathy: Pt presents for first follow up after starting diabetes medication. Patient reports compliance with metformin 1000 mg BID. He denies side effects to medicine. He is not checking his fasting sugar as recommended, he has not changed his diet, he has not started exercising and he has not made the diabetes education/nutrition appt. His fasting glucose today is 171, which is at least an improvement from prior 309. He reports this "month is busy" as reasoning for not completing the recommendations from prior visit.    Past Medical History  Diagnosis Date  . Visual changes 10/2015    Diabetic changes  . Diabetes mellitus without complication [facility](HCC)    Past Surgical History  Procedure Laterality Date  . Nasal septum surgery  1999    deviated  . Foot surgery  1988    mole/nevus   Family History  Problem Relation Age of Onset  . Diabetes Mother   . Heart disease Mother   . Heart attack Mother   . Alcohol abuse Paternal Uncle   . Dementia Paternal Grandmother   . Diabetes Paternal Grandfather   . Stroke Paternal Grandfather   . Cancer Paternal Grandfather   . Prostate cancer Paternal Grandfather      Medication List       This list is accurate as of: 01/13/16  8:31 AM.  Always use your most recent med list.               clotrimazole 1 % cream  Commonly known as:  LOTRIMIN  Apply 1 application topically 2 (two) times daily.     metFORMIN 500 MG tablet  Commonly known as:  GLUCOPHAGE  500 mg BID with food for 7 days, then 1000 mg BID with food     onetouch ultrasoft lancets     ONETOUCH VERIO test strip  Generic drug:  glucose blood        ROS: Negative, with the exception of above mentioned in HPI  Objective:  BP 130/81 mmHg  Pulse 60  Temp(Src)  98.4 F (36.9 C)  Resp 20  Ht 5\' 11"  (1.803 m)  Wt 237 lb (107.502 kg)  BMI 33.07 kg/m2  SpO2 95% Body mass index is 33.07 kg/(m^2). Gen: Afebrile. No acute distress. Nontoxic appearance, well-developed, well-nourished, Caucasian male, obese. HENT: AT. [place]. MMM, no oral lesions.  Eyes:Pupils Equal Round Reactive to light, Extraocular movements intact,  Conjunctiva without redness, discharge or icterus. Skin: Right thumb with nonhealing scaly rash. No erythema, no skin drainage. Neuro: Normal gait. PERLA. EOMi. Alert. Oriented x3   Assessment/Plan: Blake ComaRobert S Blake Davis is a 40 y.o. 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  - Encouraged to start taking daily fasting sugars.  - Urine Microalbumin w/creat. Ratio: normal. Recheck yearly.  - Continue metFORMIN 1000 mg BID, refills today. If not controlled will need to start second agent next appt.  - will need to start PNA series.  - Patient will  need every 6 months to year foot exam. - diabetes education: encouraged to make appt.  - F/U 2 months   Fungal skin infection - Needs to start medicine prescribed.    > 25 minutes spent with patient, >50% of time spent face to face counseling patient and coordinating care.  electronically signed by:  Felix Pacini, DO  [facility] - [place]

## 2016-01-13 NOTE — Patient Instructions (Signed)
Follow up in 2 months- after August 10th for followup visit.   Diabetic Retinopathy Diabetic retinopathy is a disease of the light-sensitive membrane at the back of the eye (retina). It is a complication of diabetes and a common cause of blindness. Early detection of the disease is key to keeping your eyes healthy.  CAUSES  Diabetic retinopathy is caused by blood sugar (glucose) levels that are too high over an extended period of time. High blood sugars cause damage to the small blood vessels of the retina, allowing blood to leak through the vessel walls. This causes visual impairment and eventually blindness. RISK FACTORS  High blood pressure.  Having diabetes for a long time.  Having poorly controlled blood sugars. SIGNS AND SYMPTOMS  In the early stages of diabetic retinopathy, there are often no symptoms. As the condition advances, symptoms may include:  Blurred vision. This is usually caused by a swelling due to abnormal blood glucose levels. The blurriness may go away when blood glucose levels return to normal.  Moving specks or dark spots (floaters) in your vision. These can be caused by a small retinal hemorrhage. A hemorrhage is bleeding from blood vessels.  Missing parts of your field of vision, such as things at the side. This can be caused by larger retinal hemorrhages.  Difficulty reading books or signs.  Double vision.  Pain in one or both eyes.  Feeling pressure in one or both eyes.  Trouble seeing straight lines. Straight lines do not look straight.  Redness of the eyes that does not go away. DIAGNOSIS  Your eye care specialist can detect changes in the blood vessels of your eye by putting drops in your eyes that enlarge (dilate) your pupils. This allows your eye care specialist to get a good look at your retina to see if there are any changes that have occurred as a result of your diabetes. You should have your eyes examined once a year. TREATMENT  Your eye care  specialist may use a special laser beam to seal the blood vessels of the retina and stop them from leaking. Early detection and treatment are important so that further damage to your eyes can be prevented. In addition, managing your blood sugars and keeping them in the target range can slow the progress of the disease. HOME CARE INSTRUCTIONS   Keep your blood pressure within your target range.  Keep your blood glucose levels within your target range.  Follow your health care provider's instructions regarding diet and other means for controlling your blood glucose levels.  Check your blood levels for glucose as recommended by your health care provider.  Keep regular appointments with your eye specialist. An eye specialist can usually see diabetic retinopathy developing long before it starts causing problems. In many cases, it can be treated to prevent complications from occurring. If you have diabetes, you should have your eyes checked at least every year. Your risk of retinopathy increases the longer you have the disease.  If you smoke, quit. Ask your health care provider for help if needed. Smoking can make retinopathy worse. SEEK MEDICAL CARE IF:   You notice gradual blurring or other changes in your vision over time.  You notice that your glasses or contact lenses do not make things look as sharp as they once did.  You have trouble reading or seeing details at a distance with either eye.  You notice a sudden change in your vision or notice that parts of your field of vision  appear missing or hazy.  You suddenly see moving specks or dark spots in the field of vision of either eye.  You have sudden partial loss of vision in either eye.   This information is not intended to replace advice given to you by your health care provider. Make sure you discuss any questions you have with your health care provider.   Document Released: 07/09/2000 Document Revised: 05/02/2013 Document Reviewed:  01/01/2013 Elsevier Interactive Patient Education [employer]

## 2016-03-03 ENCOUNTER — Encounter: Payer: Self-pay | Admitting: Family Medicine

## 2016-03-03 ENCOUNTER — Ambulatory Visit (INDEPENDENT_AMBULATORY_CARE_PROVIDER_SITE_OTHER): Payer: Managed Care, Other (non HMO) | Admitting: Family Medicine

## 2016-03-03 VITALS — BP 124/82 | HR 82 | Temp 98.2°F | Resp 20 | Ht 71.0 in | Wt 229.8 lb

## 2016-03-03 DIAGNOSIS — Z23 Encounter for immunization: Secondary | ICD-10-CM

## 2016-03-03 DIAGNOSIS — E1169 Type 2 diabetes mellitus with other specified complication: Secondary | ICD-10-CM

## 2016-03-03 DIAGNOSIS — E669 Obesity, unspecified: Secondary | ICD-10-CM | POA: Diagnosis not present

## 2016-03-03 DIAGNOSIS — E119 Type 2 diabetes mellitus without complications: Secondary | ICD-10-CM | POA: Diagnosis not present

## 2016-03-03 LAB — POCT GLYCOSYLATED HEMOGLOBIN (HGB A1C): HEMOGLOBIN A1C: 7.2

## 2016-03-03 NOTE — Patient Instructions (Signed)

## 2016-03-03 NOTE — Progress Notes (Signed)
Patient ID: Blake Davis, male   DOB: March 10, 1976, 40 y.o.   MRN: [id number]    Blake Davis , [birth year], 40 y.o., male MRN: [id number]  CC: New-onset diabetes type 2 Subjective:  Diabetes type 2 with retinopathy: Pt presents for second follow up after starting diabetes medication. Patient reports compliance with metformin 1000 mg BID. He denies side effects to medicine. He is not checking his fasting sugar "as regular" as he should, he has changed his diet, he has not started exercising and he has not made the diabetes education/nutrition appt. He is seeing fasting glucose above 150 on the regular. Last a1c 11.0 at diagnosis.   Lab Results  Component Value Date   HGBA1C 7.2 03/03/2016    Past Medical History:  Diagnosis Date  . Diabetes mellitus without complication (HCC)   . Visual changes 10/2015   Diabetic changes   Past Surgical History:  Procedure Laterality Date  . FOOT SURGERY  1988   mole/nevus  . NASAL SEPTUM SURGERY  1999   deviated   Family History  Problem Relation Age of Onset  . Diabetes Mother   . Heart disease Mother   . Heart attack Mother   . Alcohol abuse Paternal Uncle   . Dementia Paternal Grandmother   . Diabetes Paternal Grandfather   . Stroke Paternal Grandfather   . Cancer Paternal Grandfather   . Prostate cancer Paternal Grandfather      Medication List       Accurate as of 03/03/16  8:19 AM. Always use your most recent med list.          clotrimazole 1 % cream Commonly known as:  LOTRIMIN Apply 1 application topically 2 (two) times daily.   metFORMIN 1000 MG tablet Commonly known as:  GLUCOPHAGE 500 mg BID with food for 7 days, then 1000 mg BID with food   onetouch ultrasoft lancets   ONETOUCH VERIO test strip Generic drug:  glucose blood       ROS: Negative, with the exception of above mentioned in HPI  Objective:  BP 124/82 (BP Location: Right Arm, Patient Position: Sitting, Cuff Size: Large)   Pulse 82   Temp 98.2  F (36.8 C)   Resp 20   Ht  (1.803 m)   Wt 229 lb 12 oz (104.2 kg)   SpO2 97%   BMI 32.04 kg/m  Body mass index is 32.04 kg/m. Gen: Afebrile. No acute distress. Nontoxic appearance, well-developed, well-nourished, Caucasian male, obese. HENT: AT. [place]. MMM, no oral lesions.  Eyes:Pupils Equal Round Reactive to light, Extraocular movements intact,  Conjunctiva without redness, discharge or icterus. Skin: Right thumb with nonhealing scaly rash. No erythema, no skin drainage. Neuro: Normal gait. PERLA. EOMi. Alert. Oriented x3   Assessment/Plan: Blake Davis is a 40 y.o. 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  - Encouraged to start taking daily fasting sugars.  - Urine Microalbumin w/creat. Ratio: normal. Recheck yearly.  - Continue metFORMIN 1000 mg BID, refills today. If not controlled will need to start second agent next appt.  - will need to start Prevnar 13 today, PSV23 next year.  - Patient will need every  6 months to year foot exam. - diabetes education: encouraged to make appt.  - F/U 3  months     electronically signed by:  Felix Pacinienee Elora Wolter, DO  [facility] - [place]

## 2016-06-07 ENCOUNTER — Encounter: Payer: Self-pay | Admitting: Family Medicine

## 2016-06-07 ENCOUNTER — Ambulatory Visit (INDEPENDENT_AMBULATORY_CARE_PROVIDER_SITE_OTHER): Payer: Managed Care, Other (non HMO) | Admitting: Family Medicine

## 2016-06-07 VITALS — BP 117/79 | HR 77 | Temp 98.4°F | Resp 20 | Ht 71.0 in | Wt 233.5 lb

## 2016-06-07 DIAGNOSIS — E083211 Diabetes mellitus due to underlying condition with mild nonproliferative diabetic retinopathy with macular edema, right eye: Secondary | ICD-10-CM

## 2016-06-07 DIAGNOSIS — E083292 Diabetes mellitus due to underlying condition with mild nonproliferative diabetic retinopathy without macular edema, left eye: Secondary | ICD-10-CM | POA: Diagnosis not present

## 2016-06-07 DIAGNOSIS — E1149 Type 2 diabetes mellitus with other diabetic neurological complication: Secondary | ICD-10-CM

## 2016-06-07 DIAGNOSIS — E119 Type 2 diabetes mellitus without complications: Secondary | ICD-10-CM

## 2016-06-07 DIAGNOSIS — E1140 Type 2 diabetes mellitus with diabetic neuropathy, unspecified: Secondary | ICD-10-CM | POA: Insufficient documentation

## 2016-06-07 LAB — POCT GLYCOSYLATED HEMOGLOBIN (HGB A1C): Hemoglobin A1C: 7.3

## 2016-06-07 MED ORDER — GLIMEPIRIDE 1 MG PO TABS: 1.0000 mg | ORAL_TABLET | Freq: Every day | ORAL | 1 refills | Status: DC

## 2016-06-07 NOTE — Progress Notes (Signed)
Patient ID: Blake ComaRobert S Bensch, male   DOB: 08/13/1975, 40 y.o.   MRN: [id number]    Blake ComaRobert S Forry , 07/09/1976, 40 y.o., male MRN: [id number]  CC: New-onset diabetes type 2 Subjective:  Diabetes type 2 with retinopathy: Pt presents for follow up on Diabetes Type 2.  Patient reports compliance with metformin 1000 mg BID, he ran out of medicine last week.  He denies side effects to medicine. He is not checking his fasting sugar "as regular" as he should, he has changed his diet, he has not started exercising and he has not made the diabetes education/nutrition appt. Last a1c 11.0 --> 7.2 3 mons ago. He endorses mild numbness in bilateral feet, no open or nonhealing wounds.    Lab Results  Component Value Date   HGBA1C 7.2 03/03/2016    Past Medical History:  Diagnosis Date  . Diabetes mellitus without complication (HCC)   . Visual changes 10/2015   Diabetic changes   Past Surgical History:  Procedure Laterality Date  . FOOT SURGERY  1988   mole/nevus  . NASAL SEPTUM SURGERY  1999   deviated   Family History  Problem Relation Age of Onset  . Diabetes Mother   . Heart disease Mother   . Heart attack Mother   . Alcohol abuse Paternal Uncle   . Dementia Paternal Grandmother   . Diabetes Paternal Grandfather   . Stroke Paternal Grandfather   . Cancer Paternal Grandfather   . Prostate cancer Paternal Grandfather      Medication List       Accurate as of 06/07/16  8:14 AM. Always use your most recent med list.          clotrimazole 1 % cream Commonly known as:  LOTRIMIN Apply 1 application topically 2 (two) times daily.   metFORMIN 1000 MG tablet Commonly known as:  GLUCOPHAGE 500 mg BID with food for 7 days, then 1000 mg BID with food   onetouch ultrasoft lancets   ONETOUCH VERIO test strip Generic drug:  glucose blood       ROS: Negative, with the exception of above mentioned in HPI  Objective:  BP 117/79 (BP Location: Right Arm, Patient Position: Sitting,  Cuff Size: Large)   Pulse 77   Temp 98.4 F (36.9 C)   Resp 20   Ht 5\' 11"  (1.803 m)   Wt 233 lb 8 oz (105.9 kg)   SpO2 98%   BMI 32.57 kg/m  Body mass index is 32.57 kg/m. Gen: Afebrile. No acute distress. Nontoxic appearance, well-developed, well-nourished, Caucasian male, obese. HENT: AT. [place]. MMM, no oral lesions.  Eyes:Pupils Equal Round Reactive to light, Extraocular movements intact,  Conjunctiva without redness, discharge or icterus. Skin: Right thumb with nonhealing scaly rash. No erythema, no skin drainage. Neuro: Normal gait. PERLA. EOMi. Alert. Oriented x3  Diabetic Foot Exam - Simple   Simple Foot Form Diabetic Foot exam was performed with the following findings:  Yes 06/07/2016  8:32 AM  Visual Inspection No deformities, no ulcerations, no other skin breakdown bilaterally:  Yes Sensation Testing Intact to touch and monofilament testing bilaterally:  Yes See comments:  Yes Pulse Check Posterior Tibialis and Dorsalis pulse intact bilaterally:  Yes Comments Very long sharp, thickened nails. Mild decrease sensation to touch bilateral large toe.       Assessment/Plan: Blake Davis is a 40 y.o. 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 - Encouraged to start taking daily fasting sugars. A1c 11--> 7.2--> 7.3 today - Urine Microalbumin w/creat. Ratio: normal 12/04/2015. Recheck yearly.  - Continue metFORMIN 1000 mg BID, refills today.  - Start Amaryl 1 mg QD - Prevnar 13 completed, PSV23 due 02/2017.  - Foot exam completed 05/2016. Podiatry refill placed for long thickened nails and mild neuropathy.  -  diabetes education referral placed per his request (3rd time): encouraged to make appt.  - F/U 3  months     electronically signed by:  Felix Pacinienee Kuneff, DO  [facility] - [place]

## 2016-06-07 NOTE — Patient Instructions (Signed)
I have placed a referral to nutrition and podiatry for you.  Start Amaryl in the morning with breakfast. Keep your metformin dose the same.  Start monitoring daily fasting sugars and record.  Follow diabetic diet.    Basic Carbohydrate Counting for Diabetes Mellitus Carbohydrate counting is a method for keeping track of the amount of carbohydrates you eat. Eating carbohydrates naturally increases the level of sugar (glucose) in your blood, so it is important for you to know the amount that is okay for you to have in every meal. Carbohydrate counting helps keep the level of glucose in your blood within normal limits. The amount of carbohydrates allowed is different for every person. A dietitian can help you calculate the amount that is right for you. Once you know the amount of carbohydrates you can have, you can count the carbohydrates in the foods you want to eat. Carbohydrates are found in the following foods:  Grains, such as breads and cereals.  Dried beans and soy products.  Starchy vegetables, such as potatoes, peas, and corn.  Fruit and fruit juices.  Milk and yogurt.  Sweets and snack foods, such as cake, cookies, candy, chips, soft drinks, and fruit drinks. CARBOHYDRATE COUNTING There are two ways to count the carbohydrates in your food. You can use either of the methods or a combination of both. Reading the "Nutrition Facts" on Packaged Food The "Nutrition Facts" is an area that is included on the labels of almost all packaged food and beverages in the [place]. It includes the serving size of that food or beverage and information about the nutrients in each serving of the food, including the grams (g) of carbohydrate per serving.  Decide the number of servings of this food or beverage that you will be able to eat or drink. Multiply that number of servings by the number of grams of carbohydrate that is listed on the label for that serving. The total will be the amount of  carbohydrates you will be having when you eat or drink this food or beverage. Learning Standard Serving Sizes of Food When you eat food that is not packaged or does not include "Nutrition Facts" on the label, you need to measure the servings in order to count the amount of carbohydrates.A serving of most carbohydrate-rich foods contains about 15 g of carbohydrates. The following list includes serving sizes of carbohydrate-rich foods that provide 15 g ofcarbohydrate per serving:   1 slice of bread (1 oz) or 1 six-inch tortilla.    of a hamburger bun or English muffin.  4-6 crackers.   cup unsweetened dry cereal.    cup hot cereal.   cup rice or pasta.    cup mashed potatoes or  of a large baked potato.  1 cup fresh fruit or one small piece of fruit.    cup canned or frozen fruit or fruit juice.  1 cup milk.   cup plain fat-free yogurt or yogurt sweetened with artificial sweeteners.   cup cooked dried beans or starchy vegetable, such as peas, corn, or potatoes.  Decide the number of standard-size servings that you will eat. Multiply that number of servings by 15 (the grams of carbohydrates in that serving). For example, if you eat 2 cups of strawberries, you will have eaten 2 servings and 30 g of carbohydrates (2 servings x 15 g = 30 g). For foods such as soups and casseroles, in which more than one food is mixed in, you will need to count  the carbohydrates in each food that is included. EXAMPLE OF CARBOHYDRATE COUNTING Sample Dinner  3 oz chicken breast.   cup of brown rice.   cup of corn.  1 cup milk.   1 cup strawberries with sugar-free whipped topping.  Carbohydrate Calculation Step 1: Identify the foods that contain carbohydrates:   Rice.   Corn.   Milk.   Strawberries. Step 2:Calculate the number of servings eaten of each:   2 servings of rice.   1 serving of corn.   1 serving of milk.   1 serving of strawberries. Step 3:  Multiply each of those number of servings by 15 g:   2 servings of rice x 15 g = 30 g.   1 serving of corn x 15 g = 15 g.   1 serving of milk x 15 g = 15 g.   1 serving of strawberries x 15 g = 15 g. Step 4: Add together all of the amounts to find the total grams of carbohydrates eaten: 30 g + 15 g + 15 g + 15 g = 75 g.   This information is not intended to replace advice given to you by your health care provider. Make sure you discuss any questions you have with your health care provider.   Document Released: 07/12/2005 Document Revised: 08/02/2014 Document Reviewed: 06/08/2013 Elsevier Interactive Patient Education [employer] Inc[employer]

## 2016-06-11 ENCOUNTER — Encounter: Payer: Self-pay | Admitting: Family Medicine

## 2016-06-16 ENCOUNTER — Encounter: Payer: Self-pay | Admitting: Family Medicine

## 2016-07-06 ENCOUNTER — Encounter: Payer: Self-pay | Admitting: Podiatry

## 2016-07-06 ENCOUNTER — Ambulatory Visit (INDEPENDENT_AMBULATORY_CARE_PROVIDER_SITE_OTHER): Payer: Managed Care, Other (non HMO) | Admitting: Podiatry

## 2016-07-06 VITALS — BP 137/82 | HR 91 | Resp 16

## 2016-07-06 DIAGNOSIS — E119 Type 2 diabetes mellitus without complications: Secondary | ICD-10-CM

## 2016-07-06 DIAGNOSIS — E1341 Other specified diabetes mellitus with diabetic mononeuropathy: Secondary | ICD-10-CM

## 2016-07-06 NOTE — Patient Instructions (Signed)
your diabetic foot screen demonstrated adequate circulation. The sensation to the fishing line was intact with slightly decreased sensation to the tuning fork. Overall your detected sensation appears to be intact Please rotate your shoes Return as needed for specific problems or yearly    Diabetes and Foot Care Diabetes may cause you to have problems because of poor blood supply (circulation) to your feet and legs. This may cause the skin on your feet to become thinner, break easier, and heal more slowly. Your skin may become dry, and the skin may peel and crack. You may also have nerve damage in your legs and feet causing decreased feeling in them. You may not notice minor injuries to your feet that could lead to infections or more serious problems. Taking care of your feet is one of the most important things you can do for yourself. Follow these instructions at home:  Wear shoes at all times, even in the house. Do not go barefoot. Bare feet are easily injured.  Check your feet daily for blisters, cuts, and redness. If you cannot see the bottom of your feet, use a mirror or ask someone for help.  Wash your feet with warm water (do not use hot water) and mild soap. Then pat your feet and the areas between your toes until they are completely dry. Do not soak your feet as this can dry your skin.  Apply a moisturizing lotion or petroleum jelly (that does not contain alcohol and is unscented) to the skin on your feet and to dry, brittle toenails. Do not apply lotion between your toes.  Trim your toenails straight across. Do not dig under them or around the cuticle. File the edges of your nails with an emery board or nail file.  Do not cut corns or calluses or try to remove them with medicine.  Wear clean socks or stockings every day. Make sure they are not too tight. Do not wear knee-high stockings since they may decrease blood flow to your legs.  Wear shoes that fit properly and have enough  cushioning. To break in new shoes, wear them for just a few hours a day. This prevents you from injuring your feet. Always look in your shoes before you put them on to be sure there are no objects inside.  Do not cross your legs. This may decrease the blood flow to your feet.  If you find a minor scrape, cut, or break in the skin on your feet, keep it and the skin around it clean and dry. These areas may be cleansed with mild soap and water. Do not cleanse the area with peroxide, alcohol, or iodine.  When you remove an adhesive bandage, be sure not to damage the skin around it.  If you have a wound, look at it several times a day to make sure it is healing.  Do not use heating pads or hot water bottles. They may burn your skin. If you have lost feeling in your feet or legs, you may not know it is happening until it is too late.  Make sure your health care provider performs a complete foot exam at least annually or more often if you have foot problems. Report any cuts, sores, or bruises to your health care provider immediately. Contact a health care provider if:  You have an injury that is not healing.  You have cuts or breaks in the skin.  You have an ingrown nail.  You notice redness on your legs  or feet.  You feel burning or tingling in your legs or feet.  You have pain or cramps in your legs and feet.  Your legs or feet are numb.  Your feet always feel cold. Get help right away if:  There is increasing redness, swelling, or pain in or around a wound.  There is a red line that goes up your leg.  Pus is coming from a wound.  You develop a fever or as directed by your health care provider.  You notice a bad smell coming from an ulcer or wound. This information is not intended to replace advice given to you by your health care provider. Make sure you discuss any questions you have with your health care provider. Document Released: 07/09/2000 Document Revised: 12/18/2015  Document Reviewed: 12/19/2012 Elsevier Interactive Patient Education  2017 [employer]

## 2016-07-06 NOTE — Progress Notes (Signed)
   Subjective:    Patient ID: Blake Davis, male    DOB: 06/18/1976, 40 y.o.   MRN: [id number]  HPI    This patient presents today requesting a general foot examination. He states that is been diagnosed with diabetes in May 2017. He denies any history of foot ulceration, claudication or amputation. He relates that proximally 2 year history of decreased sensation or funny feelings in his feet feeling like a sunburn. He says the symptoms are more noticeable when his sugars are elevated  Patient denies smoking history  Review of Systems  All other systems reviewed and are negative.      Objective:   Physical Exam  5 foot 11 and weighs 240 pounds per patient  Orientated 3  Vascular: DP and PT pulses 2/4 bilaterally Reflex immediate bilaterally Capillary reflex immediately  Neurological: Sensation to 10 g monofilament wire intact 5/5 bilaterally Vibratory sensation nonreactive right reactive left Ankle reflexes reactive bilaterally  Dermatological: No open skin lesions bilaterally Mild reactive plantar callus first MPJ bilaterally  Musculoskeletal: There is no restriction ankle, subtalar, midtarsal joints bilaterally Manual motor testing dorsi flexion, plantar flexion, inversion, eversion 5/5 bilaterally      Assessment & Plan:   Assessment: Diabetic with satisfactory vascular status Protective sensation intact bilaterally Symptoms suggestive of diabetic peripheral neuropathy  Plan: I reviewed the results exam with patient today. At this time we discussed generalized foot care and emphasized the importance of reducing his A1c into a 6 range. I emphasized the need to rotate his shoes and observe his feet on a daily basis  Reappoint at patient's request or yearly

## 2016-07-14 ENCOUNTER — Encounter: Payer: Managed Care, Other (non HMO) | Attending: Family Medicine | Admitting: *Deleted

## 2016-07-14 DIAGNOSIS — Z713 Dietary counseling and surveillance: Secondary | ICD-10-CM | POA: Diagnosis present

## 2016-07-14 DIAGNOSIS — E1149 Type 2 diabetes mellitus with other diabetic neurological complication: Secondary | ICD-10-CM | POA: Diagnosis not present

## 2016-07-14 DIAGNOSIS — E083211 Diabetes mellitus due to underlying condition with mild nonproliferative diabetic retinopathy with macular edema, right eye: Secondary | ICD-10-CM

## 2016-07-14 NOTE — Progress Notes (Signed)
Diabetes Self-Management Education  Visit Type: First/Initial  Appt. Start Time: 900 Appt. End Time: 1030  07/14/2016  [name], identified by name and date of birth, is a 40 y.o. male with a diagnosis of Diabetes: Type 2. Patient here with his wife. She prepared evening meals. He eats 2-3 meals per day, typically sandwiches. He states he has a meter but doesn't use it yet due to time constraints. He gets no exercise outside of work.   ASSESSMENT  Height 5\' 11"  (1.803 m), weight 238 lb (108 kg). Body mass index is 33.19 kg/m.      Diabetes Self-Management Education - 07/14/16 0933      Psychosocial Assessment   Other persons present Patient;Spouse/SO     Dietary Intake   Breakfast sometimes none OR working days Hotdogs 2 slices of bread   Snack (morning) none   Lunch hot dogs or sandwich [place] or roast beef chips, fruit cups   Snack (afternoon) none   Dinner fast food OR chicken, veggies OR spaghetti with meat   Snack (evening) none   Beverage(s) coke zero, OJ, 2% milk, water           Individualized Plan for Diabetes Self-Management Training:   Learning Objective:  Patient will have a greater understanding of diabetes self-management. Patient education plan is to attend individual and/or group sessions per assessed needs and concerns.   Plan:   Patient Instructions  Plan:  Include protein in moderation with your meals and snacks Consider identifying foods containing carbohydrate Consider options for increasing your activity level  Consider checking BG at alternate during the week as directed by MD  Continue taking medication as directed by MD      Expected Outcomes:  Demonstrated limited interest in learning.  Expect minimal changes  Education material provided: Living Well with Diabetes, Carb counting handout, meal card  If problems or questions, patient to contact team via:  Phone and Email  Future DSME appointment: PRN

## 2016-07-14 NOTE — Patient Instructions (Signed)
Plan:  Include protein in moderation with your meals and snacks Consider identifying foods containing carbohydrate Consider options for increasing your activity level  Consider checking BG at alternate during the week as directed by MD  Continue taking medication as directed by MD

## 2016-08-26 ENCOUNTER — Other Ambulatory Visit: Payer: Self-pay | Admitting: *Deleted

## 2016-08-26 DIAGNOSIS — E119 Type 2 diabetes mellitus without complications: Secondary | ICD-10-CM

## 2016-08-26 MED ORDER — METFORMIN HCL 1000 MG PO TABS: ORAL_TABLET | ORAL | 1 refills | Status: DC

## 2016-08-26 NOTE — Telephone Encounter (Signed)
Metformin refilled

## 2016-09-06 ENCOUNTER — Encounter: Payer: Self-pay | Admitting: Family Medicine

## 2016-09-06 ENCOUNTER — Ambulatory Visit (INDEPENDENT_AMBULATORY_CARE_PROVIDER_SITE_OTHER): Payer: Managed Care, Other (non HMO) | Admitting: Family Medicine

## 2016-09-06 VITALS — BP 133/83 | HR 83 | Temp 98.4°F | Resp 20 | Ht 71.0 in | Wt 238.0 lb

## 2016-09-06 DIAGNOSIS — B369 Superficial mycosis, unspecified: Secondary | ICD-10-CM | POA: Diagnosis not present

## 2016-09-06 DIAGNOSIS — E113293 Type 2 diabetes mellitus with mild nonproliferative diabetic retinopathy without macular edema, bilateral: Secondary | ICD-10-CM

## 2016-09-06 DIAGNOSIS — E118 Type 2 diabetes mellitus with unspecified complications: Secondary | ICD-10-CM | POA: Insufficient documentation

## 2016-09-06 LAB — POCT GLYCOSYLATED HEMOGLOBIN (HGB A1C): HEMOGLOBIN A1C: 6.4

## 2016-09-06 MED ORDER — GLIMEPIRIDE 1 MG PO TABS: 1.0000 mg | ORAL_TABLET | Freq: Every day | ORAL | 1 refills | Status: DC

## 2016-09-06 MED ORDER — CLOTRIMAZOLE 1 % EX CREA: 1.0000 "application " | TOPICAL_CREAM | Freq: Two times a day (BID) | CUTANEOUS | 0 refills | Status: DC

## 2016-09-06 NOTE — Progress Notes (Signed)
Patient ID: Blake ComaRobert S Suder, male   DOB: 11/10/1975, 41 y.o.   MRN: [id number]    Blake Davis , 02/04/1976, 41 y.o., male MRN: [id number]  CC: New-onset diabetes type 2 Subjective:  Diabetes type 2 with retinopathy:  Pt reports compliance with metformin 1000 mg BID and Amaryl 1 mg Qd. Denies numbness, tingling of extremities, hypo/hyperglycemic events or non-healing wounds. Pt reports BG ranges Not checking routinely.   PNA series: 02/2016 Flu shot: 2017 UTD (recommneded yearly) BMP: 11/2015 Foot exam: 05/2016 Eye exam: 09/2015, yearly. Dr. Lewis MoccasinSana Aziem A1c: 7.3--> 6.4  Lab Results  Component Value Date   HGBA1C 6.4 09/06/2016    Past Medical History:  Diagnosis Date  . Diabetes mellitus without complication (HCC)   . Visual changes 10/2015   Diabetic changes   Past Surgical History:  Procedure Laterality Date  . FOOT SURGERY  1988   mole/nevus  . NASAL SEPTUM SURGERY  1999   deviated   Family History  Problem Relation Age of Onset  . Diabetes Mother   . Heart disease Mother   . Heart attack Mother   . Alcohol abuse Paternal Uncle   . Dementia Paternal Grandmother   . Diabetes Paternal Grandfather   . Stroke Paternal Grandfather   . Cancer Paternal Grandfather   . Prostate cancer Paternal Grandfather    Allergies as of 09/06/2016   No Known Allergies     Medication List       Accurate as of 09/06/16  8:12 AM. Always use your most recent med list.          clotrimazole 1 % cream Commonly known as:  LOTRIMIN Apply 1 application topically 2 (two) times daily.   glimepiride 1 MG tablet Commonly known as:  AMARYL Take 1 tablet (1 mg total) by mouth daily with breakfast.   metFORMIN 1000 MG tablet Commonly known as:  GLUCOPHAGE 1000 mg BID with food   onetouch ultrasoft lancets   ONETOUCH VERIO test strip Generic drug:  glucose blood       ROS: Negative, with the exception of above mentioned in HPI  Objective:  BP 133/83 (BP Location: Right Arm,  Patient Position: Sitting, Cuff Size: Large)   Pulse 83   Temp 98.4 F (36.9 C)   Resp 20   Ht 5\' 11"  (1.803 m)   Wt 238 lb (108 kg)   SpO2 99%   BMI 33.19 kg/m  Body mass index is 33.19 kg/m. Gen: Afebrile. No acute distress.  HENT: AT. [place].MM Eyes:Pupils Equal Round Reactive to light, Extraocular movements intact,  Conjunctiva without redness, discharge or icterus. CV: RRR, no edema Chest: CTAB, no wheeze or crackles Abd: Soft. NTND. BS present.  Skin: rt thumb excoriation, no  rashes, purpura or petechiae.  Neuro:  Normal gait. PERLA. EOMi. Alert. Oriented x3.   Assessment/Plan: Blake ComaRobert S Clute is a 41 y.o. 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 - Encouraged to start taking daily fasting sugars. A1c 11--> 7.2--> 7.3--> 6.4 today - Urine Microalbumin w/creat. Ratio: normal 12/04/2015. Recheck yearly.  - Continue metFORMIN 1000 mg BID - Continue Amaryl 1 mg QD, refill today PNA series: 02/2016 Flu shot: 2017 UTD (recommneded yearly) BMP: 11/2015 Foot  exam: 05/2016 Eye exam: 09/2015, yearly. Dr. Lewis Moccasin A1c: 7.3--> 6.4 - F/U 4 months  Fungal infection: Pts right thumb continues to have discomfort. This has been occurring intermittently over the last year.  - Discussed this may not be the fungal infection any longer and scar vs other pathology. He declines referral to derm today and states he will watch it closely and if decide to have derm referral with call in.   electronically signed by:  Felix Pacini, DO  [facility] [place]

## 2016-09-06 NOTE — Patient Instructions (Signed)
Great job. Continue diet and exercise.  Medications are were they need to be. Refills provided.  Follow up every 3-4 months

## 2017-01-03 ENCOUNTER — Encounter: Payer: Self-pay | Admitting: Family Medicine

## 2017-01-03 ENCOUNTER — Ambulatory Visit (INDEPENDENT_AMBULATORY_CARE_PROVIDER_SITE_OTHER): Payer: Managed Care, Other (non HMO) | Admitting: Family Medicine

## 2017-01-03 VITALS — BP 117/80 | HR 81 | Temp 98.6°F | Resp 16 | Wt 252.0 lb

## 2017-01-03 DIAGNOSIS — E083292 Diabetes mellitus due to underlying condition with mild nonproliferative diabetic retinopathy without macular edema, left eye: Secondary | ICD-10-CM

## 2017-01-03 DIAGNOSIS — E113293 Type 2 diabetes mellitus with mild nonproliferative diabetic retinopathy without macular edema, bilateral: Secondary | ICD-10-CM

## 2017-01-03 DIAGNOSIS — Z6835 Body mass index (BMI) 35.0-35.9, adult: Secondary | ICD-10-CM | POA: Diagnosis not present

## 2017-01-03 DIAGNOSIS — E083211 Diabetes mellitus due to underlying condition with mild nonproliferative diabetic retinopathy with macular edema, right eye: Secondary | ICD-10-CM | POA: Diagnosis not present

## 2017-01-03 LAB — POCT GLYCOSYLATED HEMOGLOBIN (HGB A1C): HEMOGLOBIN A1C: 7.6

## 2017-01-03 MED ORDER — GLIMEPIRIDE 1 MG PO TABS: 1.0000 mg | ORAL_TABLET | Freq: Every day | ORAL | 1 refills | Status: DC

## 2017-01-03 MED ORDER — METFORMIN HCL 1000 MG PO TABS: ORAL_TABLET | ORAL | 1 refills | Status: DC

## 2017-01-03 NOTE — Progress Notes (Signed)
Patient ID: Blake Davis, male   DOB: 03/15/1976, 41 y.o.   MRN: [id number]    Blake Davis , 10/27/1975, 41 y.o., male MRN: [id number]  Chief Complaint  Patient presents with  . Diabetes    follow up    Subjective:  Diabetes type 2 with retinopathy:  Pt reports compliance with metformin 1000 mg BID and Amaryl 1 mg Qd. Patient denies dizziness, hyperglycemic or hypoglycemic events. Patient denies numbness, tingling in the extremities or nonhealing wounds of feet.   Pt reports BG ranges Not checking routinely.  He has not been eating as well.  PNA series: 02/2016 prevnar completed, pneumovax due 02/2017 Flu shot: 2017 UTD (recommneded yearly) BMP: 11/2015 Foot exam: 05/2016 Eye exam: 09/2015, yearly. Dr. Lewis MoccasinSana Aziem A1c: 7.3--> 6.4---> 7.6 today  Lab Results  Component Value Date   HGBA1C 6.4 09/06/2016    Past Medical History:  Diagnosis Date  . Diabetes mellitus without complication (HCC)   . Visual changes 10/2015   Diabetic changes   Past Surgical History:  Procedure Laterality Date  . FOOT SURGERY  1988   mole/nevus  . NASAL SEPTUM SURGERY  1999   deviated   Family History  Problem Relation Age of Onset  . Diabetes Mother   . Heart disease Mother   . Heart attack Mother   . Alcohol abuse Paternal Uncle   . Dementia Paternal Grandmother   . Diabetes Paternal Grandfather   . Stroke Paternal Grandfather   . Cancer Paternal Grandfather   . Prostate cancer Paternal Grandfather    Allergies as of 01/03/2017   No Known Allergies     Medication List       Accurate as of 01/03/17  8:13 AM. Always use your most recent med list.          clotrimazole 1 % cream Commonly known as:  LOTRIMIN Apply 1 application topically 2 (two) times daily.   glimepiride 1 MG tablet Commonly known as:  AMARYL Take 1 tablet (1 mg total) by mouth daily with breakfast.   metFORMIN 1000 MG tablet Commonly known as:  GLUCOPHAGE 1000 mg BID with food   onetouch ultrasoft  lancets   ONETOUCH VERIO test strip Generic drug:  glucose blood       ROS: Negative, with the exception of above mentioned in HPI  Objective:  BP 117/80 (BP Location: Right Arm, Patient Position: Sitting, Cuff Size: Large)   Pulse 81   Temp 98.6 F (37 C) (Oral)   Resp 16   Wt 252 lb (114.3 kg)   SpO2 98%   BMI 35.15 kg/m  Body mass index is 35.15 kg/m. Gen: Afebrile. No acute distress.  HENT: AT. .  MMM.  Eyes:Pupils Equal Round Reactive to light, Extraocular movements intact,  Conjunctiva without redness, discharge or icterus. CV: RRR, no edema Chest: CTAB, no wheeze or crackles Neuro:  Normal gait. PERLA. EOMi. Alert. Oriented.  Assessment/Plan: Blake Davis is a 41 y.o. 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 - Encouraged to start taking daily fasting sugars.  - Urine Microalbumin w/creat. Ratio: normal 12/04/2015. Check next visit.  - Continue metFORMIN 1000 mg BID - Continue Amaryl 1 mg QD, refill today PNA series:  02/2016 prevanar, --> pneumovax due next appt.  Flu shot: 2017 UTD (recommneded yearly) BMP: 11/2015--> repeat next appt.  Foot exam: 05/2016 Eye exam: 09/2015, yearly. Dr. Lewis Moccasin A1c: 7.3--> 6.4--> 7.6 today. Pt encouraged to get back to his exercise and diet regimen. If still elevated above 7 next visit will increase amaryl.  - F/U 4 months  electronically signed by:  Felix Pacini, DO  [facility] [place]

## 2017-01-03 NOTE — Patient Instructions (Signed)
It was a good to see you. Your a1c went up to 7.4.  Lets keep the medicine the same and work on the diet and exercise better.  I think you can lower it again on your own once you get back on track.    Follow up 3-4 months.

## 2017-04-04 ENCOUNTER — Encounter: Payer: Self-pay | Admitting: Family Medicine

## 2017-04-04 ENCOUNTER — Ambulatory Visit (INDEPENDENT_AMBULATORY_CARE_PROVIDER_SITE_OTHER): Payer: Managed Care, Other (non HMO) | Admitting: Family Medicine

## 2017-04-04 VITALS — BP 130/81 | HR 90 | Temp 98.4°F | Resp 20 | Ht 71.0 in | Wt 250.5 lb

## 2017-04-04 DIAGNOSIS — E1149 Type 2 diabetes mellitus with other diabetic neurological complication: Secondary | ICD-10-CM

## 2017-04-04 DIAGNOSIS — Z6835 Body mass index (BMI) 35.0-35.9, adult: Secondary | ICD-10-CM

## 2017-04-04 DIAGNOSIS — Z23 Encounter for immunization: Secondary | ICD-10-CM | POA: Diagnosis not present

## 2017-04-04 DIAGNOSIS — E118 Type 2 diabetes mellitus with unspecified complications: Secondary | ICD-10-CM

## 2017-04-04 LAB — POCT GLYCOSYLATED HEMOGLOBIN (HGB A1C): HEMOGLOBIN A1C: 7.9

## 2017-04-04 LAB — MICROALBUMIN / CREATININE URINE RATIO
CREATININE, U: 155.3 mg/dL
MICROALB UR: 0.8 mg/dL (ref 0.0–1.9)
MICROALB/CREAT RATIO: 0.5 mg/g (ref 0.0–30.0)

## 2017-04-04 MED ORDER — METFORMIN HCL 1000 MG PO TABS: ORAL_TABLET | ORAL | 1 refills | Status: DC

## 2017-04-04 MED ORDER — GLIMEPIRIDE 1 MG PO TABS: ORAL_TABLET | ORAL | 1 refills | Status: DC

## 2017-04-04 NOTE — Progress Notes (Signed)
Patient ID: Blake ComaRobert S Inman, male   DOB: 03/12/1976, 41 y.o.   MRN: [id number]    Blake ComaRobert S Mui , 08/25/1975, 41 y.o., male MRN: [id number]  Chief Complaint  Patient presents with  . Diabetes    Subjective:  Diabetes type 2 with retinopathy:  Pt reports compliance with metformin 1000 mg BID and Amaryl 1 mg Qd again today.  Patient denies dizziness, hyperglycemic or hypoglycemic events. Patient denies numbness, tingling in the extremities or nonhealing wounds of feet. Pt reports BG ranges Not checking routinely.  He has not been watching his diet. PNA series: series now completed. 04/04/2017 Flu shot: 2017 UTD (recommneded yearly)--> he is getting this years flu shot throgh his wife 's employer.  BMP: 11/2015 WNL Foot exam: 05/2016 Eye exam: 09/2015, yearly. Dr. Eloisa NorthernSana Aziem--> pt reports eye exam 12./2017--> records needed. A1c: 7.3--> 6.4---> 7.6--> 7.9 today  Lab Results  Component Value Date   HGBA1C 7.6 01/03/2017    Past Medical History:  Diagnosis Date  . Diabetes mellitus without complication (HCC)   . Visual changes 10/2015   Diabetic changes   Past Surgical History:  Procedure Laterality Date  . FOOT SURGERY  1988   mole/nevus  . NASAL SEPTUM SURGERY  1999   deviated   Family History  Problem Relation Age of Onset  . Diabetes Mother   . Heart disease Mother   . Heart attack Mother   . Alcohol abuse Paternal Uncle   . Dementia Paternal Grandmother   . Diabetes Paternal Grandfather   . Stroke Paternal Grandfather   . Cancer Paternal Grandfather   . Prostate cancer Paternal Grandfather    Allergies as of 04/04/2017   No Known Allergies     Medication List       Accurate as of 04/04/17  8:14 AM. Always use your most recent med list.          glimepiride 1 MG tablet Commonly known as:  AMARYL Take 1 tablet (1 mg total) by mouth daily with breakfast.   metFORMIN 1000 MG tablet Commonly known as:  GLUCOPHAGE 1000 mg BID with food   onetouch ultrasoft  lancets   ONETOUCH VERIO test strip Generic drug:  glucose blood            Discharge Care Instructions        Start     Ordered   04/04/17 0000  POCT glycosylated hemoglobin (Hb A1C)     04/04/17 0806      ROS: Negative, with the exception of above mentioned in HPI  Objective:  BP 130/81 (BP Location: Right Arm, Patient Position: Sitting, Cuff Size: Large)   Pulse 90   Temp 98.4 F (36.9 C)   Resp 20   Ht 5\' 11"  (1.803 m)   Wt 250 lb 8 oz (113.6 kg)   SpO2 98%   BMI 34.94 kg/m  Body mass index is 34.94 kg/m. Gen: Afebrile. No acute distress.  HENT: AT. .  MMM.  Eyes:Pupils Equal Round Reactive to light, Extraocular movements intact,  Conjunctiva without redness, discharge or icterus. Neck/lymp/endocrine: Supple,no lymphadenopathy, no thyromegaly CV: RRR no murmur, no edema, +2/4 P posterior tibialis pulses Chest: CTAB, no wheeze or crackles Abd: Soft. NTND. BS present. no Masses palpated. Skin: no rashes, purpura or petechiae.  Neuro:  Normal gait. PERLA. EOMi. Alert. Oriented.   Assessment/Plan: Blake ComaRobert S Christians is a 41 y.o. male present for OV for  Type 2 diabetes mellitus with complication, without long-term current use  of insulin (HCC) Other diabetic neurological complication associated with type 2 diabetes mellitus (HCC) BMI 35.0-35.9,adult/Morbid obesity (HCC) - diet and exercise modifications encouraged.  PNA series: series now completed. 04/04/2017 Flu shot: 2017 UTD (recommneded yearly)--> he is getting this years flu shot throgh his wife 's employer.  BMP: 11/2015 WNL--> ordered today.  Foot exam: 05/2016 Eye exam: 09/2015, yearly. Dr. Eloisa Northern Aziem--> pt reports eye exam 12./2017--> records needed. A1c: 7.3--> 6.4---> 7.6--> 7.9 today Increase Amaryl from 1 mg to 3 mg QD.  glimepiride (AMARYL) 1 MG tablet; 2 mg PO QD for 1 week then 3 mg PO QD  Dispense: 270 tablet; Refill: 1 Continue with refills provided today for  metFORMIN (GLUCOPHAGE) 1000 MG  tablet; 1000 mg BID with food  Dispense: 180 tablet; Refill: 1 - Microalbumin / creatinine urine ratio - f/u 4 months  electronically signed by:  Felix Pacini, DO  [facility] [place]

## 2017-04-04 NOTE — Patient Instructions (Addendum)
Great to see you. Your a1c increased to 7.9. Watch the diet, exercise a bit more.  Increase amaryl to 3 mg a day Make sure to get us your eye exam records.   See you in 4 months.

## 2017-04-05 LAB — BASIC METABOLIC PANEL WITH GFR
BUN: 16 mg/dL (ref 7–25)
CALCIUM: 9.6 mg/dL (ref 8.6–10.3)
CHLORIDE: 102 mmol/L (ref 98–110)
CO2: 26 mmol/L (ref 20–32)
Creat: 0.89 mg/dL (ref 0.60–1.35)
GFR, Est African American: 121 mL/min/{1.73_m2} (ref >=60)
GFR, Est Non African American: 105 mL/min/{1.73_m2} (ref >=60)
Glucose, Bld: 243 mg/dL — ABNORMAL HIGH (ref 65–99)
POTASSIUM: 4.5 mmol/L (ref 3.5–5.3)
Sodium: 138 mmol/L (ref 135–146)

## 2017-04-05 LAB — EXTRA LAV TOP TUBE

## 2017-07-05 ENCOUNTER — Ambulatory Visit: Payer: Managed Care, Other (non HMO) | Admitting: Podiatry

## 2017-08-04 ENCOUNTER — Ambulatory Visit: Payer: Managed Care, Other (non HMO) | Admitting: Family Medicine

## 2017-08-04 ENCOUNTER — Encounter: Payer: Self-pay | Admitting: Family Medicine

## 2017-08-04 VITALS — BP 128/75 | HR 79 | Temp 97.9°F | Wt 260.4 lb

## 2017-08-04 DIAGNOSIS — E1149 Type 2 diabetes mellitus with other diabetic neurological complication: Secondary | ICD-10-CM

## 2017-08-04 DIAGNOSIS — R1310 Dysphagia, unspecified: Secondary | ICD-10-CM

## 2017-08-04 DIAGNOSIS — M79672 Pain in left foot: Secondary | ICD-10-CM

## 2017-08-04 DIAGNOSIS — Z23 Encounter for immunization: Secondary | ICD-10-CM | POA: Diagnosis not present

## 2017-08-04 DIAGNOSIS — E118 Type 2 diabetes mellitus with unspecified complications: Secondary | ICD-10-CM | POA: Diagnosis not present

## 2017-08-04 LAB — POCT GLYCOSYLATED HEMOGLOBIN (HGB A1C): HEMOGLOBIN A1C: 7.9

## 2017-08-04 MED ORDER — GLIMEPIRIDE 4 MG PO TABS: ORAL_TABLET | ORAL | 1 refills | Status: DC

## 2017-08-04 NOTE — Patient Instructions (Signed)
Increase your amaryl. New bottle is 4 mg tabs each.  Start 1 tab with meal daily for 1 week, then increase to 1.5 tabs daily (6 mg).   Increase your exercise to > 150 minutes a week. LOWER CARB diet.  Try heel cushions for feet.   Have podiatrist and eye doctor send their exams to use.   Referral to gastro (GI) for swallowing issues.   Follow up 3 months.    Please help [place] help you:  We are honored you have chosen [facility] for your Primary Care home. Below you will find basic instructions that you may need to access in the future. Please help [place] help you by reading the instructions, which cover many of the frequent questions we experience.   Prescription refills and request:  -In order to allow more efficient response time, please call your pharmacy for all refills. They will forward the request electronically to [place]. This allows for the quickest possible response. Request left on a nurse line can take longer to refill, since these are checked as time allows between office patients and other phone calls.  - refill request can take up to 3-5 working days to complete.  - If request is sent electronically and request is appropiate, it is usually completed in 1-2 business days.  - all patients will need to be seen routinely for all chronic medical conditions requiring prescription medications (see follow-up below). If you are overdue for follow up on your condition, you will be asked to make an appointment and we will call in enough medication to cover you until your appointment (up to 30 days).  - all controlled substances will require a face to face visit to request/refill.  - if you desire your prescriptions to go through a new pharmacy, and have an active script at original pharmacy, you will need to call your pharmacy and have scripts transferred to new pharmacy. This is completed between the pharmacy locations and not by your provider.    Results: If any images or labs were  ordered, it can take up to 1 week to get results depending on the test ordered and the lab/facility running and resulting the test. - Normal or stable results, which do not need further discussion, may be released to your mychart immediately with attached note to you. A call may not be generated for normal results. Please make certain to sign up for mychart. If you have questions on how to activate your mychart you can call the front office.  - If your results need further discussion, our office will attempt to contact you via phone, and if unable to reach you after 2 attempts, we will release your abnormal result to your mychart with instructions.  - All results will be automatically released in mychart after 1 week.  - Your provider will provide you with explanation and instruction on all relevant material in your results. Please keep in mind, results and labs may appear confusing or abnormal to the untrained eye, but it does not mean they are actually abnormal for you personally. If you have any questions about your results that are not covered, or you desire more detailed explanation than what was provided, you should make an appointment with your provider to do so.   Our office handles many outgoing and incoming calls daily. If we have not contacted you within 1 week about your results, please check your mychart to see if there is a message first and if not,  then contact our office.  In helping with this matter, you help decrease call volume, and therefore allow us to be able to respond to patients needs more efficiently.   Acute office visits (sick visit):  An acute visit is intended for a new problem and are scheduled in shorter time slots to allow schedule openings for patients with new problems. This is the appropriate visit to discuss a new problem. In order to provide you with excellent quality medical care with proper time for you to explain your problem, have an exam and receive treatment with  instructions, these appointments should be limited to one new problem per visit. If you experience a new problem, in which you desire to be addressed, please make an acute office visit, we save openings on the schedule to accommodate you. Please do not save your new problem for any other type of visit, let us take care of it properly and quickly for you.   Follow up visits:  Depending on your condition(s) your provider will need to see you routinely in order to provide you with quality care and prescribe medication(s). Most chronic conditions (Example: hypertension, Diabetes, depression/anxiety... etc), require visits a couple times a year. Your provider will instruct you on proper follow up for your personal medical conditions and history. Please make certain to make follow up appointments for your condition as instructed. Failing to do so could result in lapse in your medication treatment/refills. If you request a refill, and are overdue to be seen on a condition, we will always provide you with a 30 day script (once) to allow you time to schedule.    Medicare wellness (well visit): - we have a wonderful Nurse Selena Batten(Kim), that will meet with you and provide you will yearly medicare wellness visits. These visits should occur yearly (can not be scheduled less than 1 calendar year apart) and cover preventive health, immunizations, advance directives and screenings you are entitled to yearly through your medicare benefits. Do not miss out on your entitled benefits, this is when medicare will pay for these benefits to be ordered for you.  These are strongly encouraged by your provider and is the appropriate type of visit to make certain you are up to date with all preventive health benefits. If you have not had your medicare wellness exam in the last 12 months, please make certain to schedule one by calling the office and schedule your medicare wellness with Selena BattenKim as soon as possible.   Yearly physical (well visit):   - Adults are recommended to be seen yearly for physicals. Check with your insurance and date of your last physical, most insurances require one calendar year between physicals. Physicals include all preventive health topics, screenings, medical exam and labs that are appropriate for gender/age and history. You may have fasting labs needed at this visit. This is a well visit (not a sick visit), new problems should not be covered during this visit (see acute visit).  - Pediatric patients are seen more frequently when they are younger. Your provider will advise you on well child visit timing that is appropriate for your their age. - This is not a medicare wellness visit. Medicare wellness exams do not have an exam portion to the visit. Some medicare companies allow for a physical, some do not allow a yearly physical. If your medicare allows a yearly physical you can schedule the medicare wellness with our nurse Selena BattenKim and have your physical with your provider after, on the same day.  Please check with insurance for your full benefits.   Late Policy/No Shows:  - all new patients should arrive 15-30 minutes earlier than appointment to allow [place] time  to  obtain all personal demographics,  insurance information and for you to complete office paperwork. - All established patients should arrive 10-15 minutes earlier than appointment time to update all information and be checked in .  - In our best efforts to run on time, if you are late for your appointment you will be asked to either reschedule or if able, we will work you back into the schedule. There will be a wait time to work you back in the schedule,  depending on availability.  - If you are unable to make it to your appointment as scheduled, please call 24 hours ahead of time to allow [place] to fill the time slot with someone else who needs to be seen. If you do not cancel your appointment ahead of time, you may be charged a no show fee.

## 2017-08-04 NOTE — Progress Notes (Signed)
Patient ID: Blake Davis, male   DOB: 09/06/1975, 42 y.o.   MRN: [id number]    Blake Davis , 12/12/1975, 42 y.o., male MRN: [id number]  Chief Complaint  Patient presents with  . Diabetes    follow up    Subjective:  Diabetes type 2 with retinopathy:  Pt reports compliance with metformin 1000 mg BID and Amaryl 3 mg QD today. He does admit to missing dose occasional but not often. Patient denies dizziness, hyperglycemic or hypoglycemic events. Patient denies new numbness, tingling in the extremities or nonhealing wounds of feet. He admits to chronic "mild decrease in sensation left large toe," which is chronic. He has not been watching his diet and had gained ~25 lbs over the last year.  PNA series: series now completed. 04/04/2017 Flu shot: 2018 UTD (recommneded yearly) BMP: 04/04/2017 WNL kidney fx, glucose elevated Foot exam: 07/2017--> completed today, he has podiatry appt this week for yearly check.  Eye exam:  Dr. Eloisa NorthernSana Aziem--> pt reports eye exam 12./2017--> records needed. A1c: 7.3--> 6.4---> 7.6--> 7.9--> 7.9 today  - Microalbumin / creatinine urine ratio: normal 04/04/2017  Left heel pain: New complaint. Presents today with left heel pain complaint. This is a new complaint for him. He reports the pain is mostly on the sides of his heel. The pain is worsened after a long day on his feet. The pain is not present in the morning or when taking first steps. The pain is not located on the bottom of his foot or heel. He denies any injury, redness or swelling.  Dysphagia: New complaint. Patient reports he has had instances over the last 10 years of rice being difficult to swallow. However over the last few months he has noticed more types of foods/solids are becoming difficult to swallow and he is occasionally needing to regurgitate the food. He denies any incidences of choking. He denies any difficulty swallowing liquids.  Lab Results  Component Value Date   HGBA1C 7.9 04/04/2017     Past Medical History:  Diagnosis Date  . Diabetes mellitus without complication (HCC)   . Visual changes 10/2015   Diabetic changes   Past Surgical History:  Procedure Laterality Date  . FOOT SURGERY  1988   mole/nevus  . NASAL SEPTUM SURGERY  1999   deviated   Family History  Problem Relation Age of Onset  . Diabetes Mother   . Heart disease Mother   . Heart attack Mother   . Alcohol abuse Paternal Uncle   . Dementia Paternal Grandmother   . Diabetes Paternal Grandfather   . Stroke Paternal Grandfather   . Cancer Paternal Grandfather   . Prostate cancer Paternal Grandfather    Allergies as of 08/04/2017   No Known Allergies     Medication List        Accurate as of 08/04/17  8:10 AM. Always use your most recent med list.          glimepiride 1 MG tablet Commonly known as:  AMARYL 2 mg PO QD for 1 week then 3 mg PO QD   metFORMIN 1000 MG tablet Commonly known as:  GLUCOPHAGE 1000 mg BID with food   onetouch ultrasoft lancets   ONETOUCH VERIO test strip Generic drug:  glucose blood       ROS: Negative, with the exception of above mentioned in HPI  Objective:  BP 128/75 (BP Location: Left Arm, Patient Position: Sitting, Cuff Size: Large)   Pulse 79   Temp 97.9  F (36.6 C) (Oral)   Wt 260 lb 6.4 oz (118.1 kg)   SpO2 97%   BMI 36.32 kg/m  Body mass index is 36.32 kg/m. Gen: Afebrile. No acute distress. Nontoxic in appearance, well developed, well nourished, obese male.  HENT: AT. [place].MMM.  Eyes:Pupils Equal Round Reactive to light, Extraocular movements intact,  Conjunctiva without redness, discharge or icterus. Neck/lymp/endocrine: Supple,no lymphadenopathy, no thyromegaly CV: RRR no murmur, no edema, +2/4 P posterior tibialis pulses Chest: CTAB, no wheeze or crackles Abd: Soft. NTND. BS present. no Masses palpated.  MSK: Left foot: no erythema, soft tissue swelling or pain to heel. Mild discomfort medial calcaneus to palpation.  Skin: no  rashes, purpura or petechiae.  Neuro:  Normal gait. PERLA. EOMi. Alert. Oriented x3 Diabetic Foot Exam - Simple   Simple Foot Form Diabetic Foot exam was performed with the following findings:  Yes 08/04/2017  9:32 AM  Visual Inspection No deformities, no ulcerations, no other skin breakdown bilaterally:  Yes Sensation Testing Intact to touch and monofilament testing bilaterally:  Yes Pulse Check Posterior Tibialis and Dorsalis pulse intact bilaterally:  Yes Comments Long nails present bilaterally. Callus formation 1st metatarsal head plantar aspect. Mild decrease in sensation bilateral large toes (L>R)     Assessment/Plan: Blake Davis is a 42 y.o. male present for OV for  Type 2 diabetes mellitus with complication, without long-term current use of insulin (HCC) Other diabetic neurological complication associated with type 2 diabetes mellitus (HCC) /Morbid obesity (HCC) - Diabetes inadequately controlled and patient is gaining weight. Goal A1c ~6.5 - diet and exercise modifications STRONGLY encouraged. Pt has seen dietician. He has GAINED 25 lbs. Low sugar/carbohydrate diet recommended.  PNA series: series now completed. 04/04/2017 Flu shot: 2018 UTD (recommneded yearly) BMP: 04/04/2017 WNL kidney fx, glucose elevated Foot exam: 07/2017--> today Eye exam: 09/2015, yearly. Dr. Eloisa Northern Aziem--> pt reports eye exam 12./2017--> records needed. A1c: 7.3--> 6.4---> 7.6--> 7.9--> 7.9 again today (despite increase in Amaryl last appt) - CONTINUE metformin - INCREASE amaryl--> taper to 6 mg a day (1.5 tabs) - Microalbumin / creatinine urine ratio: normal 04/04/2017 - POCT glycosylated hemoglobin (Hb A1C) - glimepiride (AMARYL) 4 MG tablet; Take 1 tablet (4 mg total) by mouth daily with breakfast for 7 days, THEN 1.5 tablets (6 mg total) daily with breakfast.  Dispense: 135 tablet; Refill: 1  Pain of left heel - new. Encouraged pt to try heel cups/cushions, supportive shoes/arch support. -  Patient is established with podiatry, recommend he follow with them concerning this matter if heel cushions do not improve his symptoms. Symptoms are not consistent with plantar fascitis or bone spurring (location). Possibly early tarsal tunnel syndrome. He does have bilateral decreased sensation in large toes L>R, which was prior felt to be secondary to his diabetes condition.   Dysphagia, unspecified type - New problem to this provider. For some solids only, present > 10 years, intermittent but more frequent over last few months and now involving more types of food and textures. Discussed different possible etiologies with him today and agreed to referral to GI for further eval.  - Ambulatory referral to Gastroenterology  Immunization due - Tdap vaccine greater than or equal to 7yo IM  Greater than 40 minutes spent with patient, >50% of time spent face to face counseling and/or coordinating care to specialist    electronically signed by:  Felix Pacini, DO  [facility] - [place]

## 2017-08-08 ENCOUNTER — Ambulatory Visit: Payer: Managed Care, Other (non HMO) | Admitting: Podiatry

## 2017-08-12 ENCOUNTER — Encounter: Payer: Self-pay | Admitting: Podiatry

## 2017-08-12 ENCOUNTER — Other Ambulatory Visit: Payer: Self-pay | Admitting: Podiatry

## 2017-08-12 ENCOUNTER — Ambulatory Visit (INDEPENDENT_AMBULATORY_CARE_PROVIDER_SITE_OTHER): Payer: Managed Care, Other (non HMO)

## 2017-08-12 ENCOUNTER — Ambulatory Visit: Payer: Managed Care, Other (non HMO) | Admitting: Podiatry

## 2017-08-12 DIAGNOSIS — E1149 Type 2 diabetes mellitus with other diabetic neurological complication: Secondary | ICD-10-CM

## 2017-08-12 DIAGNOSIS — M722 Plantar fascial fibromatosis: Secondary | ICD-10-CM

## 2017-08-12 DIAGNOSIS — E1140 Type 2 diabetes mellitus with diabetic neuropathy, unspecified: Secondary | ICD-10-CM

## 2017-08-12 DIAGNOSIS — M79671 Pain in right foot: Secondary | ICD-10-CM

## 2017-08-12 MED ORDER — TRIAMCINOLONE ACETONIDE 10 MG/ML IJ SUSP
10.0000 mg | Freq: Once | INTRAMUSCULAR | Status: AC
  Administered 2017-08-12: 10 mg

## 2017-08-12 NOTE — Patient Instructions (Signed)

## 2017-08-15 NOTE — Progress Notes (Signed)
Subjective:   Patient ID: Blake Davis, male   DOB: 42 y.o.   MRN: [id number]   HPI Patient presents with pain in the bottom of his right heel and is also concerned about numbness of his feet progression of diabetic nerve pain   ROS      Objective:  Physical Exam  Neurovascular status intact with patient noted to have discomfort plantar aspect right heel at the insertional point tending calcaneus with inflammation noted and is also noted to have moderate diminishment of sharp dull vibratory bilateral that does not seem to have progressed from previous visit     Assessment:  Acute plantar fasciitis right with neuropathic changes noted     Plan:  H&P condition reviewed and at this point I injected the plantar heel right 3 mg Kenalog 5 mg Xylocaine and instructed on physical therapy supportive shoes and will see back if symptoms indicate.  Discussed neuropathy and do not recommend any changes except he should continue with balance type exercises  X-rays indicate that there is some spurring but no indication of stress fracture or advanced arthritis

## 2017-08-30 ENCOUNTER — Encounter: Payer: Self-pay | Admitting: Family Medicine

## 2017-08-30 LAB — HM DIABETES EYE EXAM

## 2017-09-08 ENCOUNTER — Encounter: Payer: Self-pay | Admitting: Physician Assistant

## 2017-09-20 ENCOUNTER — Ambulatory Visit: Payer: Managed Care, Other (non HMO) | Admitting: Physician Assistant

## 2017-09-30 ENCOUNTER — Encounter: Payer: Self-pay | Admitting: Physician Assistant

## 2017-09-30 ENCOUNTER — Ambulatory Visit: Payer: Managed Care, Other (non HMO) | Admitting: Physician Assistant

## 2017-09-30 VITALS — BP 128/80 | HR 97 | Ht 71.0 in | Wt 256.0 lb

## 2017-09-30 DIAGNOSIS — R1314 Dysphagia, pharyngoesophageal phase: Secondary | ICD-10-CM | POA: Diagnosis not present

## 2017-09-30 NOTE — Progress Notes (Signed)
Agree with assessment and plan as outlined.  

## 2017-09-30 NOTE — Patient Instructions (Signed)
If you are age 44 or older, your body mass index should be between 23-30. Your Body mass index is 35.7 kg/m. If this is out of the aforementioned range listed, please consider follow up with your Primary Care Provider.  If you are age 44 or younger, your body mass index should be between 19-25. Your Body mass index is 35.7 kg/m. If this is out of the aformentioned range listed, please consider follow up with your Primary Care Provider.   You have been scheduled for an endoscopy. Please follow written instructions given to you at your visit today. If you use inhalers (even only as needed), please bring them with you on the day of your procedure. Your physician has requested that you go to www.startemmi.com and enter the access code given to you at your visit today. This web site gives a general overview about your procedure. However, you should still follow specific instructions given to you by our office regarding your preparation for the procedure.  Thank you for choosing me and [facility].   Hyacinth MeekerJennifer Lemmon, PA-C

## 2017-09-30 NOTE — Progress Notes (Signed)
Chief Complaint: Dysphagia  HPI:    [name] is a 42 year old male with a past medical history of diabetes, who was referred to me by Felix Pacini A, DO for a complaint of dysphagia.      Today, explains that for the past 3-4 years has had trouble eating rice the second day after it has been in the fridge overnight, noting that it would get stuck on its way down and sometimes come back up.  This was contained to just rice until the past 6 months or so where he has started to notice this with other solid foods including meats and breads and " dryer things".  Typically, he will regurgitate them minutes after eating and then will feel fine.    Denies fever, chills, melena, nausea, vomiting, heartburn, reflux, abdominal pain or change in bowel habits.  Past Medical History:  Diagnosis Date  . Diabetes mellitus without complication (HCC)   . Visual changes 10/2015   Diabetic changes    Past Surgical History:  Procedure Laterality Date  . FOOT SURGERY  1988   mole/nevus  . NASAL SEPTUM SURGERY  1999   deviated    Current Outpatient Medications  Medication Sig Dispense Refill  . glimepiride (AMARYL) 4 MG tablet Take 1 tablet (4 mg total) by mouth daily with breakfast for 7 days, THEN 1.5 tablets (6 mg total) daily with breakfast. 135 tablet 1  . Lancets (ONETOUCH ULTRASOFT) lancets     . metFORMIN (GLUCOPHAGE) 1000 MG tablet 1000 mg BID with food 180 tablet 1  . ONETOUCH VERIO test strip      No current facility-administered medications for this visit.     Allergies as of 09/30/2017  . (No Known Allergies)    Family History  Problem Relation Age of Onset  . Diabetes Mother   . Heart disease Mother   . Heart attack Mother   . Alcohol abuse Paternal Uncle   . Dementia Paternal Grandmother   . Diabetes Paternal Grandfather   . Stroke Paternal Grandfather   . Cancer Paternal Grandfather   . Prostate cancer Paternal Grandfather     Social History   Socioeconomic History   . Marital status: Married    Spouse name: Melissa  . Number of children: 0  . Years of education: BA  . Highest education level: Not on file  Social Needs  . Financial resource strain: Not on file  . Food insecurity - worry: Not on file  . Food insecurity - inability: Not on file  . Transportation needs - medical: Not on file  . Transportation needs - non-medical: Not on file  Occupational History  . Occupation: Psychologist, forensic: OTHER    Comment: [employer]  Tobacco Use  . Smoking status: Never Smoker  . Smokeless tobacco: Never Used  Substance and Sexual Activity  . Alcohol use: No    Alcohol/week: 0.0 oz  . Drug use: No  . Sexual activity: Yes    Birth control/protection: None    Comment: male partner  Other Topics Concern  . Not on file  Social History Narrative   Patient lives at home with wife (Mel). No children.   Has two pets.    Bachelors degree, works as a Visual merchandiser   Drinks caffeinated beverages   Wears his seatbelt   Smoke detector in home   Feels safe in his relationships    Review of Systems:    Constitutional: No weight  loss, fever or chills Skin: No rash  Cardiovascular: No chest pain Respiratory: No SOB Gastrointestinal: See HPI and otherwise negative Genitourinary: No dysuria  Neurological: No headache, dizziness or syncope Musculoskeletal: No new muscle or joint pain Hematologic: No bleeding  Psychiatric: No history of depression or anxiety   Physical Exam:  Vital signs: BP 128/80   Pulse 97   Ht 5\' 11"  (1.803 m)   Wt 256 lb (116.1 kg)   SpO2 98%   BMI 35.70 kg/m   Constitutional:   Pleasant overweight Caucasian male appears to be in NAD, Well developed, Well nourished, alert and cooperative Head:  Normocephalic and atraumatic. Eyes:   PEERL, EOMI. No icterus. Conjunctiva pink. Ears:  Normal auditory acuity. Neck:  Supple Throat: Oral cavity and pharynx without inflammation, swelling or  lesion.  Respiratory: Respirations even and unlabored. Lungs clear to auscultation bilaterally.   No wheezes, crackles, or rhonchi.  Cardiovascular: Normal S1, S2. No MRG. Regular rate and rhythm. No peripheral edema, cyanosis or pallor.  Gastrointestinal:  Soft, nondistended, nontender. No rebound or guarding. Normal bowel sounds. No appreciable masses or hepatomegaly. Rectal:  Not performed.  Msk:  Symmetrical without gross deformities. Without edema, no deformity or joint abnormality.  Neurologic:  Alert and  oriented x4;  grossly normal neurologically.  Skin:   Dry and intact without significant lesions or rashes. Psychiatric:  Demonstrates good judgement and reason without abnormal affect or behaviors.  No recent labs or imaging.  Assessment: 1.  Dysphagia: Solid foods getting stuck times 1 year, regurgitation, denies heartburn, reflux or abdominal pain; consider esophageal stricture most likely versus dysmotility with history of DM  Plan: 1.  Scheduled patient for EGD with likely dilation in the LEC with Dr. Adela LankArmbruster.  Did discuss risk, benefits, limitations and alternatives and patient agrees to proceed. 2.  Reviewed anti-dysphagia measures including taking small bites, drinking sips of water in between bites, avoiding distraction while eating and the chin tuck technique. 3.  Patient to follow in clinic per recommendations from Dr. Adela LankArmbruster after time of procedure.  Hyacinth MeekerJennifer Calogero Geisen, PA-C [facility] 09/30/2017, 11:01 AM  Cc: Felix PaciniKuneff, Renee A, DO

## 2017-10-10 ENCOUNTER — Encounter: Payer: Self-pay | Admitting: Gastroenterology

## 2017-10-21 ENCOUNTER — Ambulatory Visit (AMBULATORY_SURGERY_CENTER): Payer: Managed Care, Other (non HMO) | Admitting: Gastroenterology

## 2017-10-21 ENCOUNTER — Other Ambulatory Visit: Payer: Self-pay

## 2017-10-21 ENCOUNTER — Encounter: Payer: Self-pay | Admitting: Gastroenterology

## 2017-10-21 VITALS — BP 124/91 | HR 89 | Temp 96.9°F | Resp 17 | Ht 71.0 in | Wt 256.0 lb

## 2017-10-21 DIAGNOSIS — R1314 Dysphagia, pharyngoesophageal phase: Secondary | ICD-10-CM

## 2017-10-21 DIAGNOSIS — K222 Esophageal obstruction: Secondary | ICD-10-CM | POA: Diagnosis not present

## 2017-10-21 DIAGNOSIS — K209 Esophagitis, unspecified without bleeding: Secondary | ICD-10-CM

## 2017-10-21 DIAGNOSIS — K229 Disease of esophagus, unspecified: Secondary | ICD-10-CM | POA: Diagnosis not present

## 2017-10-21 DIAGNOSIS — K210 Gastro-esophageal reflux disease with esophagitis: Secondary | ICD-10-CM | POA: Diagnosis not present

## 2017-10-21 MED ORDER — SODIUM CHLORIDE 0.9 % IV SOLN
500.0000 mL | Freq: Once | INTRAVENOUS | Status: DC
Start: 2017-10-21 — End: 2022-03-18

## 2017-10-21 MED ORDER — OMEPRAZOLE 40 MG PO CPDR: 40.0000 mg | DELAYED_RELEASE_CAPSULE | Freq: Every day | ORAL | 3 refills | Status: DC

## 2017-10-21 NOTE — Progress Notes (Signed)
Report to PACU, RN, vss, BBS= Clear.  Pt vommited right at end of procedure.  Suctioned immediately.  See Dr A report

## 2017-10-21 NOTE — Patient Instructions (Addendum)
YOU HAD AN ENDOSCOPIC PROCEDURE TODAY AT THE [facility]:   Refer to the procedure report that was given to you for any specific questions about what was found during the examination.  If the procedure report does not answer your questions, please call your gastroenterologist to clarify.  If you requested that your care partner not be given the details of your procedure findings, then the procedure report has been included in a sealed envelope for you to review at your convenience later.  YOU SHOULD EXPECT: Some feelings of bloating in the abdomen. Passage of more gas than usual.  Walking can help get rid of the air that was put into your GI tract during the procedure and reduce the bloating. If you had a lower endoscopy (such as a colonoscopy or flexible sigmoidoscopy) you may notice spotting of blood in your stool or on the toilet paper. If you underwent a bowel prep for your procedure, you may not have a normal bowel movement for a few days.  Please Note:  You might notice some irritation and congestion in your nose or some drainage.  This is from the oxygen used during your procedure.  There is no need for concern and it should clear up in a day or so.  SYMPTOMS TO REPORT IMMEDIATELY:   Following upper endoscopy (EGD)  Vomiting of blood or coffee ground material  New chest pain or pain under the shoulder blades  Painful or persistently difficult swallowing  New shortness of breath  Fever of 100F or higher  Black, tarry-looking stools  For urgent or emergent issues, a gastroenterologist can be reached at any hour by calling [phone].   DIET:  Please follow the dilation diet for the rest of today.  See handout provided.  Drink plenty of fluids but you should avoid alcoholic beverages for 24 hours.  ACTIVITY:  You should plan to take it easy for the rest of today and you should NOT DRIVE or use heavy machinery until tomorrow (because of the sedation medicines used during  the test).    FOLLOW UP: Our staff will call the number listed on your records the next business day following your procedure to check on you and address any questions or concerns that you may have regarding the information given to you following your procedure. If we do not reach you, we will leave a message.  However, if you are feeling well and you are not experiencing any problems, there is no need to return our call.  We will assume that you have returned to your regular daily activities without incident.  If any biopsies were taken you will be contacted by phone or by letter within the next 1-3 weeks.  Please call us at [phone] if you have not heard about the biopsies in 3 weeks.    SIGNATURES/CONFIDENTIALITY: You and/or your care partner have signed paperwork which will be entered into your electronic medical record.  These signatures attest to the fact that that the information above on your After Visit Summary has been reviewed and is understood.  Full responsibility of the confidentiality of this discharge information lies with you and/or your care-partner.   Thank you for allowing us to provide your healthcare today.

## 2017-10-21 NOTE — Progress Notes (Signed)
Pt's states no medical or surgical changes since previsit or office visit. 

## 2017-10-21 NOTE — Progress Notes (Signed)
Called to room to assist during endoscopic procedure.  Patient ID and intended procedure confirmed with present staff. Received instructions for my participation in the procedure from the performing physician.  

## 2017-10-21 NOTE — Op Note (Signed)
[facility] Patient Name: Blake Davis Procedure Date: 10/21/2017 9:55 AM MRN: [id number] Endoscopist: Viviann Spare P. Buena Boehm MD, MD Age: 42 Referring MD:  Date of Birth: Apr 20, 1976 Gender: Male Account #: [id number] Procedure:                Upper GI endoscopy Indications:              Dysphagia Medicines:                Monitored Anesthesia Care Procedure:                Pre-Anesthesia Assessment:                           - Prior to the procedure, a History and Physical                            was performed, and patient medications and                            allergies were reviewed. The patient's tolerance of                            previous anesthesia was also reviewed. The risks                            and benefits of the procedure and the sedation                            options and risks were discussed with the patient.                            All questions were answered, and informed consent                            was obtained. Prior Anticoagulants: The patient has                            taken no previous anticoagulant or antiplatelet                            agents. ASA Grade Assessment: III - A patient with                            severe systemic disease. After reviewing the risks                            and benefits, the patient was deemed in                            satisfactory condition to undergo the procedure.                           After obtaining informed consent, the endoscope was  passed under direct vision. Throughout the                            procedure, the patient's blood pressure, pulse, and                            oxygen saturations were monitored continuously. The                            Endoscope was introduced through the mouth, and                            advanced to the body of the stomach. The upper GI                            endoscopy was accomplished without  difficulty. The                            patient tolerated the procedure. Scope In: Scope Out: Findings:                 Esophagogastric landmarks were identified: the                            Z-line was found at 38 cm, the gastroesophageal                            junction was found at 38 cm and the upper extent of                            the gastric folds was found at 38 cm from the                            incisors.                           One mild benign-appearing, intrinsic stenosis was                            found 38 cm from the incisors. This measured less                            than one cm (in length) and was traversed. A TTS                            dilator was passed through the scope. Dilation with                            a 16-17-18 mm balloon dilator was performed to 16                            mm, 17 mm and 18 mm after which an appropriate  mucosal wrent was noted.                           Mucosal changes including ringed esophagus and                            longitudinal furrows were found in the upper third                            of the esophagus and in the middle third of the                            esophagus, suspicious for eosinophilic esophagitis.                            Biopsies were taken with a cold forceps for                            histology.                           The exam of the esophagus was otherwise normal.                           The cardia and gastric fundus were normal.                           A small amount of food (residue) / gastric                            secretions was found in the distal gastric body.                            Examination of the distal stomach and small bowel                            was not performed in this light. Upon withdrawing                            the endoscope at the end of the procedure the                            patient  regurgitated some gastric contents. Oxygen                            saturation remained normal. Complications:            No immediate complications. Estimated blood loss:                            Minimal. Patient regurgitated some gastric contents                            at the end of the exam Estimated Blood Loss:  Estimated blood loss was minimal. Impression:               - Esophagogastric landmarks identified.                           - Benign-appearing esophageal stenosis. Dilated to                            18mm with good result.                           - Esophageal mucosal changes suggestive of                            eosinophilic esophagitis. Biopsied.                           - Normal cardia and gastric fundus.                           - A small amount of food (residue) in the stomach,                            concerning for possible underlying gastroparesis.                            The exam of the distal stomach and small bowel not                            performed. Recommendation:           - Patient has a contact number available for                            emergencies. The signs and symptoms of potential                            delayed complications were discussed with the                            patient. Return to normal activities tomorrow.                            Written discharge instructions were provided to the                            patient.                           - Resume previous diet.                           - Continue present medications.                           - Start omeprazole 40mg  once daily                           -  Await pathology results with further                            recommendations Viviann SpareSteven P. Cyrstal Leitz MD, MD 10/21/2017 10:22:02 AM This report has been signed electronically.

## 2017-10-24 ENCOUNTER — Telehealth: Payer: Self-pay

## 2017-10-24 NOTE — Telephone Encounter (Signed)
  Follow up Call-  Call back number 10/21/2017  Post procedure Call Back phone  # [phone]  Permission to leave phone message Yes     Patient questions:  Do you have a fever, pain , or abdominal swelling? No. Pain Score  0 *  Have you tolerated food without any problems? Yes.    Have you been able to return to your normal activities? Yes.    Do you have any questions about your discharge instructions: Diet   No. Medications  No. Follow up visit  No.  Do you have questions or concerns about your Care? No.  Actions: * If pain score is 4 or above: No action needed, pain <4.

## 2017-10-24 NOTE — Telephone Encounter (Signed)
  Follow up Call-  Call back number 10/21/2017  Post procedure Call Back phone  # [phone]  Permission to leave phone message Yes     Unable to leave a message, the patients voicemail has not yet been set up.

## 2017-10-27 ENCOUNTER — Encounter: Payer: Self-pay | Admitting: Gastroenterology

## 2017-11-02 ENCOUNTER — Encounter: Payer: Self-pay | Admitting: Family Medicine

## 2017-11-02 ENCOUNTER — Ambulatory Visit: Payer: Managed Care, Other (non HMO) | Admitting: Family Medicine

## 2017-11-02 VITALS — BP 121/79 | HR 83 | Temp 98.2°F | Ht 71.0 in | Wt 257.0 lb

## 2017-11-02 DIAGNOSIS — E118 Type 2 diabetes mellitus with unspecified complications: Secondary | ICD-10-CM | POA: Diagnosis not present

## 2017-11-02 DIAGNOSIS — E1149 Type 2 diabetes mellitus with other diabetic neurological complication: Secondary | ICD-10-CM

## 2017-11-02 LAB — POCT GLYCOSYLATED HEMOGLOBIN (HGB A1C): HEMOGLOBIN A1C: 8.5

## 2017-11-02 MED ORDER — GLIMEPIRIDE 4 MG PO TABS: ORAL_TABLET | ORAL | 1 refills | Status: DC

## 2017-11-02 MED ORDER — SITAGLIPTIN PHOSPHATE 100 MG PO TABS: 100.0000 mg | ORAL_TABLET | Freq: Every day | ORAL | 3 refills | Status: DC

## 2017-11-02 MED ORDER — METFORMIN HCL 1000 MG PO TABS: ORAL_TABLET | ORAL | 1 refills | Status: DC

## 2017-11-02 NOTE — Patient Instructions (Signed)
Your a1c is 8.5, this rasied a good deal from prior. Definitely need to buckle down and get this improved.  Keep the metformin and amaryl, added [place].    Diabetes Mellitus and Nutrition When you have diabetes (diabetes mellitus), it is very important to have healthy eating habits because your blood sugar (glucose) levels are greatly affected by what you eat and drink. Eating healthy foods in the appropriate amounts, at about the same times every day, can help you:  Control your blood glucose.  Lower your risk of heart disease.  Improve your blood pressure.  Reach or maintain a healthy weight.  Every person with diabetes is different, and each person has different needs for a meal plan. Your health care provider may recommend that you work with a diet and nutrition specialist (dietitian) to make a meal plan that is best for you. Your meal plan may vary depending on factors such as:  The calories you need.  The medicines you take.  Your weight.  Your blood glucose, blood pressure, and cholesterol levels.  Your activity level.  Other health conditions you have, such as heart or kidney disease.  How do carbohydrates affect me? Carbohydrates affect your blood glucose level more than any other type of food. Eating carbohydrates naturally increases the amount of glucose in your blood. Carbohydrate counting is a method for keeping track of how many carbohydrates you eat. Counting carbohydrates is important to keep your blood glucose at a healthy level, especially if you use insulin or take certain oral diabetes medicines. It is important to know how many carbohydrates you can safely have in each meal. This is different for every person. Your dietitian can help you calculate how many carbohydrates you should have at each meal and for snack. Foods that contain carbohydrates include:  Bread, cereal, rice, pasta, and crackers.  Potatoes and corn.  Peas, beans, and lentils.  Milk and  yogurt.  Fruit and juice.  Desserts, such as cakes, cookies, ice cream, and candy.  How does alcohol affect me? Alcohol can cause a sudden decrease in blood glucose (hypoglycemia), especially if you use insulin or take certain oral diabetes medicines. Hypoglycemia can be a life-threatening condition. Symptoms of hypoglycemia (sleepiness, dizziness, and confusion) are similar to symptoms of having too much alcohol. If your health care provider says that alcohol is safe for you, follow these guidelines:  Limit alcohol intake to no more than 1 drink per day for nonpregnant women and 2 drinks per day for men. One drink equals 12 oz of beer, 5 oz of wine, or 1 oz of hard liquor.  Do not drink on an empty stomach.  Keep yourself hydrated with water, diet soda, or unsweetened iced tea.  Keep in mind that regular soda, juice, and other mixers may contain a lot of sugar and must be counted as carbohydrates.  What are tips for following this plan? Reading food labels  Start by checking the serving size on the label. The amount of calories, carbohydrates, fats, and other nutrients listed on the label are based on one serving of the food. Many foods contain more than one serving per package.  Check the total grams (g) of carbohydrates in one serving. You can calculate the number of servings of carbohydrates in one serving by dividing the total carbohydrates by 15. For example, if a food has 30 g of total carbohydrates, it would be equal to 2 servings of carbohydrates.  Check the number of grams (g) of saturated  and trans fats in one serving. Choose foods that have low or no amount of these fats.  Check the number of milligrams (mg) of sodium in one serving. Most people should limit total sodium intake to less than 2,300 mg per day.  Always check the nutrition information of foods labeled as "low-fat" or "nonfat". These foods may be higher in added sugar or refined carbohydrates and should be  avoided.  Talk to your dietitian to identify your daily goals for nutrients listed on the label. Shopping  Avoid buying canned, premade, or processed foods. These foods tend to be high in fat, sodium, and added sugar.  Shop around the outside edge of the grocery store. This includes fresh fruits and vegetables, bulk grains, fresh meats, and fresh dairy. Cooking  Use low-heat cooking methods, such as baking, instead of high-heat cooking methods like deep frying.  Cook using healthy oils, such as olive, canola, or sunflower oil.  Avoid cooking with butter, cream, or high-fat meats. Meal planning  Eat meals and snacks regularly, preferably at the same times every day. Avoid going long periods of time without eating.  Eat foods high in fiber, such as fresh fruits, vegetables, beans, and whole grains. Talk to your dietitian about how many servings of carbohydrates you can eat at each meal.  Eat 4-6 ounces of lean protein each day, such as lean meat, chicken, fish, eggs, or tofu. 1 ounce is equal to 1 ounce of meat, chicken, or fish, 1 egg, or 1/4 cup of tofu.  Eat some foods each day that contain healthy fats, such as avocado, nuts, seeds, and fish. Lifestyle   Check your blood glucose regularly.  Exercise at least 30 minutes 5 or more days each week, or as told by your health care provider.  Take medicines as told by your health care provider.  Do not use any products that contain nicotine or tobacco, such as cigarettes and e-cigarettes. If you need help quitting, ask your health care provider.  Work with a Social worker or diabetes educator to identify strategies to manage stress and any emotional and social challenges. What are some questions to ask my health care provider?  Do I need to meet with a diabetes educator?  Do I need to meet with a dietitian?  What number can I call if I have questions?  When are the best times to check my blood glucose? Where to find more  information:  American Diabetes Association: diabetes.org/food-and-fitness/food  Academy of Nutrition and Dietetics: PokerClues.dk  [employer] of Diabetes and Digestive and Kidney Diseases (NIH): ContactWire.be Summary  A healthy meal plan will help you control your blood glucose and maintain a healthy lifestyle.  Working with a diet and nutrition specialist (dietitian) can help you make a meal plan that is best for you.  Keep in mind that carbohydrates and alcohol have immediate effects on your blood glucose levels. It is important to count carbohydrates and to use alcohol carefully. This information is not intended to replace advice given to you by your health care provider. Make sure you discuss any questions you have with your health care provider. Document Released: 04/08/2005 Document Revised: 08/16/2016 Document Reviewed: 08/16/2016 Elsevier Interactive Patient Education  [employer].

## 2017-11-02 NOTE — Progress Notes (Signed)
Patient ID: Blake Davis, male   DOB: 01/11/1976, 42 y.o.   MRN: [id number]    Blake Davis , 10/04/1975, 42 y.o., male MRN: [id number]  Chief Complaint  Patient presents with  . Follow-up    DM    Subjective:  Diabetes type 2 with retinopathy:  Pt reports compliance with metformin 1000 mg BID and Amaryl 6 mg QD. He does admit to missing dose occasional but not often.Patient denies dizziness, hyperglycemic or hypoglycemic events. Patient denies new numbness, tingling in the extremities or nonhealing wounds of feet.  He has not been watching his diet and had gained ~25 lbs over the last year.  PNA series: series now completed. 04/04/2017 Flu shot: 2018 UTD (recommneded yearly) BMP: 04/04/2017 WNL kidney fx, glucose elevated Foot exam: 07/2017--> completed today, he has podiatry appt this week for yearly check.  Eye exam:  08/2017 Dr. Eloisa NorthernSana Aziem--> retinopathy A1c: 7.3--> 6.4---> 7.6--> 7.9--> 7.9--> 8.5  today  - Microalbumin / creatinine urine ratio: normal 04/04/2017   Lab Results  Component Value Date   HGBA1C 7.9 08/04/2017    Past Medical History:  Diagnosis Date  . Diabetes mellitus without complication (HCC)   . Visual changes 10/2015   Diabetic changes   Past Surgical History:  Procedure Laterality Date  . FOOT SURGERY  1988   mole/nevus  . NASAL SEPTUM SURGERY  1999   deviated   Family History  Problem Relation Age of Onset  . Diabetes Mother   . Heart disease Mother   . Heart attack Mother   . Alcohol abuse Paternal Uncle   . Dementia Paternal Grandmother   . Diabetes Paternal Grandfather   . Stroke Paternal Grandfather   . Cancer Paternal Grandfather   . Prostate cancer Paternal Grandfather    Allergies as of 11/02/2017   No Known Allergies     Medication List        Accurate as of 11/02/17  8:04 AM. Always use your most recent med list.          glimepiride 4 MG tablet Commonly known as:  AMARYL Take 1 tablet (4 mg total) by mouth daily with  breakfast for 7 days, THEN 1.5 tablets (6 mg total) daily with breakfast. Start taking on:  08/04/2017   metFORMIN 1000 MG tablet Commonly known as:  GLUCOPHAGE 1000 mg BID with food   omeprazole 40 MG capsule Commonly known as:  PRILOSEC Take 1 capsule (40 mg total) by mouth daily. One every am on an empty stomach. Twenty to thirty minutes before breakfast.   onetouch ultrasoft lancets   ONETOUCH VERIO test strip Generic drug:  glucose blood       ROS: Negative, with the exception of above mentioned in HPI  Objective:  BP 121/79 (BP Location: Left Arm, Patient Position: Sitting, Cuff Size: Large)   Pulse 83   Temp 98.2 F (36.8 C) (Oral)   Ht 5\' 11"  (1.803 m)   Wt 257 lb (116.6 kg)   SpO2 96%   BMI 35.84 kg/m  Body mass index is 35.84 kg/m. Gen: Afebrile. No acute distress.  HENT: AT. [place].  MMM. Eyes:Pupils Equal Round Reactive to light, Extraocular movements intact,  Conjunctiva without redness, discharge or icterus. CV: RRR no murmur, no edema, +2/4 P posterior tibialis pulses Chest: CTAB, no wheeze or crackles Abd: Soft. obese. NTND. BS present.  Neuro:  Normal gait. PERLA. EOMi. Alert. Oriented x3  Assessment/Plan: Blake Davis is a 42 y.o. male present  for OV for  Type 2 diabetes mellitus with complication, without long-term current use of insulin (HCC) Other diabetic neurological complication associated with type 2 diabetes mellitus (HCC) /Morbid obesity (HCC) - Diabetes inadequately controlled and patient is gaining weight. Goal A1c ~6.5 - diet and exercise modifications STRONGLY encouraged. Pt has seen dietician. He has GAINED 25 lbs. Low sugar/carbohydrate diet recommended.  PNA series: series now completed. 04/04/2017 Flu shot: 2018 UTD (recommneded yearly) BMP: 04/04/2017 WNL kidney fx, glucose elevated Foot exam: 07/2017 completed Eye exam: 08/2017, Retinopathy present, yearly. Dr. Lewis Moccasin  A1c: 7.3--> 6.4---> 7.6--> 7.9--> 7.9--> 8.5  today  -  CONTINUE metformin - INCREASE amaryl--> taper to 6 mg a day (1.5 tabs) - Microalbumin / creatinine urine ratio: normal 04/04/2017 - POCT glycosylated hemoglobin (Hb A1C) - glimepiride (AMARYL) 4 MG tablet; Take 1 tablet (4 mg total) by mouth daily with breakfast for 7 days, THEN 1.5 tablets (6 mg total) daily with breakfast.  Dispense: 135 tablet; Refill: 1   electronically signed by:  Felix Pacini, DO  [facility] [place]

## 2018-02-03 ENCOUNTER — Ambulatory Visit: Payer: Managed Care, Other (non HMO) | Admitting: Family Medicine

## 2018-02-03 ENCOUNTER — Encounter: Payer: Self-pay | Admitting: Family Medicine

## 2018-02-03 ENCOUNTER — Telehealth: Payer: Self-pay | Admitting: Family Medicine

## 2018-02-03 VITALS — BP 130/85 | HR 88 | Resp 16 | Ht 71.0 in | Wt 252.0 lb

## 2018-02-03 DIAGNOSIS — E1149 Type 2 diabetes mellitus with other diabetic neurological complication: Secondary | ICD-10-CM

## 2018-02-03 DIAGNOSIS — L84 Corns and callosities: Secondary | ICD-10-CM | POA: Insufficient documentation

## 2018-02-03 DIAGNOSIS — E118 Type 2 diabetes mellitus with unspecified complications: Secondary | ICD-10-CM

## 2018-02-03 DIAGNOSIS — Z130 Encounter for screening for diseases of the blood and blood-forming organs and certain disorders involving the immune mechanism: Secondary | ICD-10-CM

## 2018-02-03 LAB — BASIC METABOLIC PANEL
BUN: 14 mg/dL (ref 6–23)
CHLORIDE: 102 meq/L (ref 96–112)
CO2: 26 mEq/L (ref 19–32)
Calcium: 9.6 mg/dL (ref 8.4–10.5)
Creatinine, Ser: 1.03 mg/dL (ref 0.40–1.50)
GFR: 83.41 mL/min (ref >60.00)
GLUCOSE: 372 mg/dL — AB (ref 70–99)
POTASSIUM: 4.9 meq/L (ref 3.5–5.1)
Sodium: 138 mEq/L (ref 135–145)

## 2018-02-03 LAB — LDL CHOLESTEROL, DIRECT: Direct LDL: 139 mg/dL

## 2018-02-03 LAB — CBC WITH DIFFERENTIAL/PLATELET
Basophils Absolute: 0.3 10*3/uL — ABNORMAL HIGH (ref 0.0–0.1)
Basophils Relative: 4.6 % — ABNORMAL HIGH (ref 0.0–3.0)
EOS PCT: 3.5 % (ref 0.0–5.0)
Eosinophils Absolute: 0.2 10*3/uL (ref 0.0–0.7)
HCT: 49.3 % (ref 39.0–52.0)
Hemoglobin: 16.5 g/dL (ref 13.0–17.0)
LYMPHS ABS: 2 10*3/uL (ref 0.7–4.0)
Lymphocytes Relative: 31.2 % (ref 12.0–46.0)
MCHC: 33.4 g/dL (ref 30.0–36.0)
MCV: 86.9 fl (ref 78.0–100.0)
MONO ABS: 0.5 10*3/uL (ref 0.1–1.0)
Monocytes Relative: 7.5 % (ref 3.0–12.0)
NEUTROS PCT: 53.2 % (ref 43.0–77.0)
Neutro Abs: 3.4 10*3/uL (ref 1.4–7.7)
Platelets: 92 10*3/uL — ABNORMAL LOW (ref 150.0–400.0)
RBC: 5.67 Mil/uL (ref 4.22–5.81)
RDW: 13.5 % (ref 11.5–15.5)
WBC: 6.4 10*3/uL (ref 4.0–10.5)

## 2018-02-03 LAB — LIPID PANEL
CHOL/HDL RATIO: 6
CHOLESTEROL: 203 mg/dL — AB (ref 0–200)
HDL: 34.7 mg/dL — ABNORMAL LOW (ref >39.00)
NonHDL: 168.74
Triglycerides: 238 mg/dL — ABNORMAL HIGH (ref 0.0–149.0)
VLDL: 47.6 mg/dL — ABNORMAL HIGH (ref 0.0–40.0)

## 2018-02-03 LAB — POCT GLYCOSYLATED HEMOGLOBIN (HGB A1C): HEMOGLOBIN A1C: 10.3 % — AB (ref 4.0–5.6)

## 2018-02-03 MED ORDER — METFORMIN HCL 1000 MG PO TABS: ORAL_TABLET | ORAL | 1 refills | Status: DC

## 2018-02-03 MED ORDER — ATORVASTATIN CALCIUM 20 MG PO TABS: 20.0000 mg | ORAL_TABLET | Freq: Every day | ORAL | 3 refills | Status: DC

## 2018-02-03 MED ORDER — GLIMEPIRIDE 4 MG PO TABS: 4.0000 mg | ORAL_TABLET | Freq: Every day | ORAL | 1 refills | Status: DC

## 2018-02-03 NOTE — Patient Instructions (Signed)
amaryl 1 tab daily Start the [place] ordered last visit.  Continue metformin.  I have referred you back to podiatrist for foot exam.  A1c today is >10. You must take this condition very serious with this level of a1c.  If still elevated >9 next visit will either start insulin or refer to endocrine.   Increase exercise > 150 minutes a week.  Diabetic diet must be implemented in order to get thi sunder better control and get you back to being healthy.     Diabetes Mellitus and Nutrition When you have diabetes (diabetes mellitus), it is very important to have healthy eating habits because your blood sugar (glucose) levels are greatly affected by what you eat and drink. Eating healthy foods in the appropriate amounts, at about the same times every day, can help you:  Control your blood glucose.  Lower your risk of heart disease.  Improve your blood pressure.  Reach or maintain a healthy weight.  Every person with diabetes is different, and each person has different needs for a meal plan. Your health care provider may recommend that you work with a diet and nutrition specialist (dietitian) to make a meal plan that is best for you. Your meal plan may vary depending on factors such as:  The calories you need.  The medicines you take.  Your weight.  Your blood glucose, blood pressure, and cholesterol levels.  Your activity level.  Other health conditions you have, such as heart or kidney disease.  How do carbohydrates affect me? Carbohydrates affect your blood glucose level more than any other type of food. Eating carbohydrates naturally increases the amount of glucose in your blood. Carbohydrate counting is a method for keeping track of how many carbohydrates you eat. Counting carbohydrates is important to keep your blood glucose at a healthy level, especially if you use insulin or take certain oral diabetes medicines. It is important to know how many carbohydrates you can safely have  in each meal. This is different for every person. Your dietitian can help you calculate how many carbohydrates you should have at each meal and for snack. Foods that contain carbohydrates include:  Bread, cereal, rice, pasta, and crackers.  Potatoes and corn.  Peas, beans, and lentils.  Milk and yogurt.  Fruit and juice.  Desserts, such as cakes, cookies, ice cream, and candy.  How does alcohol affect me? Alcohol can cause a sudden decrease in blood glucose (hypoglycemia), especially if you use insulin or take certain oral diabetes medicines. Hypoglycemia can be a life-threatening condition. Symptoms of hypoglycemia (sleepiness, dizziness, and confusion) are similar to symptoms of having too much alcohol. If your health care provider says that alcohol is safe for you, follow these guidelines:  Limit alcohol intake to no more than 1 drink per day for nonpregnant women and 2 drinks per day for men. One drink equals 12 oz of beer, 5 oz of wine, or 1 oz of hard liquor.  Do not drink on an empty stomach.  Keep yourself hydrated with water, diet soda, or unsweetened iced tea.  Keep in mind that regular soda, juice, and other mixers may contain a lot of sugar and must be counted as carbohydrates.  What are tips for following this plan? Reading food labels  Start by checking the serving size on the label. The amount of calories, carbohydrates, fats, and other nutrients listed on the label are based on one serving of the food. Many foods contain more than one serving per package.  Check the total grams (g) of carbohydrates in one serving. You can calculate the number of servings of carbohydrates in one serving by dividing the total carbohydrates by 15. For example, if a food has 30 g of total carbohydrates, it would be equal to 2 servings of carbohydrates.  Check the number of grams (g) of saturated and trans fats in one serving. Choose foods that have low or no amount of these  fats.  Check the number of milligrams (mg) of sodium in one serving. Most people should limit total sodium intake to less than 2,300 mg per day.  Always check the nutrition information of foods labeled as "low-fat" or "nonfat". These foods may be higher in added sugar or refined carbohydrates and should be avoided.  Talk to your dietitian to identify your daily goals for nutrients listed on the label. Shopping  Avoid buying canned, premade, or processed foods. These foods tend to be high in fat, sodium, and added sugar.  Shop around the outside edge of the grocery store. This includes fresh fruits and vegetables, bulk grains, fresh meats, and fresh dairy. Cooking  Use low-heat cooking methods, such as baking, instead of high-heat cooking methods like deep frying.  Cook using healthy oils, such as olive, canola, or sunflower oil.  Avoid cooking with butter, cream, or high-fat meats. Meal planning  Eat meals and snacks regularly, preferably at the same times every day. Avoid going long periods of time without eating.  Eat foods high in fiber, such as fresh fruits, vegetables, beans, and whole grains. Talk to your dietitian about how many servings of carbohydrates you can eat at each meal.  Eat 4-6 ounces of lean protein each day, such as lean meat, chicken, fish, eggs, or tofu. 1 ounce is equal to 1 ounce of meat, chicken, or fish, 1 egg, or 1/4 cup of tofu.  Eat some foods each day that contain healthy fats, such as avocado, nuts, seeds, and fish. Lifestyle   Check your blood glucose regularly.  Exercise at least 30 minutes 5 or more days each week, or as told by your health care provider.  Take medicines as told by your health care provider.  Do not use any products that contain nicotine or tobacco, such as cigarettes and e-cigarettes. If you need help quitting, ask your health care provider.  Work with a Social worker or diabetes educator to identify strategies to manage stress  and any emotional and social challenges. What are some questions to ask my health care provider?  Do I need to meet with a diabetes educator?  Do I need to meet with a dietitian?  What number can I call if I have questions?  When are the best times to check my blood glucose? Where to find more information:  American Diabetes Association: diabetes.org/food-and-fitness/food  Academy of Nutrition and Dietetics: PokerClues.dk  [employer] of Diabetes and Digestive and Kidney Diseases (NIH): ContactWire.be Summary  A healthy meal plan will help you control your blood glucose and maintain a healthy lifestyle.  Working with a diet and nutrition specialist (dietitian) can help you make a meal plan that is best for you.  Keep in mind that carbohydrates and alcohol have immediate effects on your blood glucose levels. It is important to count carbohydrates and to use alcohol carefully. This information is not intended to replace advice given to you by your health care provider. Make sure you discuss any questions you have with your health care provider. Document Released: 04/08/2005 Document Revised:  08/16/2016 Document Reviewed: 08/16/2016 Elsevier Interactive Patient Education  [employer]2018 Elsevier Inc.

## 2018-02-03 NOTE — Telephone Encounter (Signed)
Please inform patient the following information: His cholesterol is rather high. He will need medication to help bring down his cholesterol and help provide him with cardiovascular protection. I have called this in for him today. Please fast again (water only -8 hours) for next appt, and we will retest levels.  He should also start OTC fish oil supplement 2000 mg a day  His sugar was > 350, he has to make sure to take all three DM meds, this is a dangerous level. Increase water to at least 80 ounces a day. Follow diabetic diet.

## 2018-02-03 NOTE — Progress Notes (Signed)
Patient ID: Blake Davis, male   DOB: [birth year], 42 y.o.   MRN: [id number]    [name] , May 06, 1976, 42 y.o., male MRN: [id number]  Chief Complaint  Patient presents with  . Follow-up    DM    Subjective:  Diabetes type 2 with retinopathy:  Pt reports compliance with metformin 1000 mg BID and he was not taking the Amaryl correctly. Suppose to have start Amaryl 6 mg QD--> he was only take 1/2 tab, instead of 1.5 tabs. He did not start the [place]. Patient denies dizziness, hyperglycemic or hypoglycemic events. Patient denies numbness, tingling in the extremities or nonhealing wounds of feet. He does have a complaint of painful callus  on th bottom of his foot. He has very mild chronic neuropathy.  PNA series: series now completed. 04/04/2017 Flu shot: 2018 UTD (recommneded yearly) BMP: 04/04/2017 WNL kidney fx, glucose elevated--> collect today Foot exam: 07/2017--> completed today, he has podiatry appt this week for yearly check.  Eye exam:  08/2017 Dr. Eloisa Northern Aziem--> retinopathy A1c: 7.3--> 6.4---> 7.6--> 7.9--> 7.9--> 8.5--> 10.3  today  - Microalbumin / creatinine urine ratio: normal 04/04/2017--> collect next visit.    Lab Results  Component Value Date   HGBA1C 10.3 (A) 02/03/2018    Past Medical History:  Diagnosis Date  . Diabetes mellitus without complication (HCC)   . Visual changes 10/2015   Diabetic changes   Past Surgical History:  Procedure Laterality Date  . FOOT SURGERY  1988   mole/nevus  . NASAL SEPTUM SURGERY  1999   deviated   Family History  Problem Relation Age of Onset  . Diabetes Mother   . Heart disease Mother   . Heart attack Mother   . Alcohol abuse Paternal Uncle   . Dementia Paternal Grandmother   . Diabetes Paternal Grandfather   . Stroke Paternal Grandfather   . Cancer Paternal Grandfather   . Prostate cancer Paternal Grandfather    Allergies as of 02/03/2018   No Known Allergies     Medication List        Accurate as of  02/03/18  9:00 AM. Always use your most recent med list.          glimepiride 4 MG tablet Commonly known as:  AMARYL Take 1 tablet (4 mg total) by mouth daily with breakfast for 7 days, THEN 1.5 tablets (6 mg total) daily with breakfast. Start taking on:  11/02/2017   metFORMIN 1000 MG tablet Commonly known as:  GLUCOPHAGE 1000 mg BID with food   omeprazole 40 MG capsule Commonly known as:  PRILOSEC Take 1 capsule (40 mg total) by mouth daily. One every am on an empty stomach. Twenty to thirty minutes before breakfast.   onetouch ultrasoft lancets   ONETOUCH VERIO test strip Generic drug:  glucose blood   sitaGLIPtin 100 MG tablet Commonly known as:  JANUVIA Take 1 tablet (100 mg total) by mouth daily.       ROS: Negative, with the exception of above mentioned in HPI  Objective:  BP 130/85 (BP Location: Left Arm, Patient Position: Sitting, Cuff Size: Normal)   Pulse 88   Resp 16   Ht 5\' 11"  (1.803 m)   Wt 252 lb (114.3 kg)   SpO2 96%   BMI 35.15 kg/m  Body mass index is 35.15 kg/m. Gen: Afebrile. No acute distress.  HENT: AT. [place].  MMM. Eyes:Pupils Equal Round Reactive to light, Extraocular movements intact,  Conjunctiva without redness, discharge or  icterus. CV: RRR no murmur, no edema, +2/4 P posterior tibialis pulses Chest: CTAB, no wheeze or crackles Abd: Soft. obese. NTND. BS present.  Neuro:  Normal gait. PERLA. EOMi. Alert. Oriented x3 Results for orders placed or performed in visit on 02/03/18 (from the past 24 hour(s))  POCT glycosylated hemoglobin (Hb A1C)     Status: Abnormal   Collection Time: 02/03/18  9:00 AM  Result Value Ref Range   Hemoglobin A1C 10.3 (A) 4.0 - 5.6 %   HbA1c POC (<> result, manual entry)  4.0 - 5.6 %   HbA1c, POC (prediabetic range)  5.7 - 6.4 %   HbA1c, POC (controlled diabetic range)  0.0 - 7.0 %     Assessment/Plan: Blake Davis is a 42 y.o. male present for OV for  Type 2 diabetes mellitus with complication,  without long-term current use of insulin (HCC) Other diabetic neurological complication associated with type 2 diabetes mellitus (HCC) /Morbid obesity (HCC) - Diabetes inadequately controlled and patient is gaining weight. Goal A1c ~6.5 - diet and exercise modifications STRONGLY encouraged. Pt has seen dietician. He continues to gain weight .  PNA series: series now completed. 04/04/2017 Flu shot: 2018 UTD (recommneded yearly) BMP: 04/04/2017 WNL kidney fx, glucose elevated--> collect today Foot exam: 07/2017--> completed today, referred back to podiatry. Eye exam:  08/2017 Dr. Eloisa NorthernS[place] Aziem--> retinopathy A1c: 7.3--> 6.4---> 7.6--> 7.9--> 7.9--> 8.5--> 10.3  today  - Microalbumin / creatinine urine ratio: normal 04/04/2017--> collect next visit.  - CONTINUE metformin. Continue javuvia 1000 - INCREASE amaryl--> taper to 8 mg  ADD januvia - Microalbumin / creatinine urine ratio: normal 04/04/2017 - POCT glycosylated hemoglobin (Hb A1C) - glimepiride (AMARYL) 4 MG tablet; Take 1 tablet (4 mg total) by mouth daily with breakfast for 7 days, THEN 1.5 tablets (6 mg total) daily with breakfast.  Dispense: 135 tablet; Refill: 1  > 25 minutes spent with patient, >50% of time spent face to face counseling and coordinating care.     electronically signed by:  Felix Pacinienee Linzy Laury, DO  [facility] [place]

## 2018-02-06 NOTE — Telephone Encounter (Signed)
Phone call attempted. Home and mobile phone unable to take messages. Will try to call back at later time.

## 2018-02-06 NOTE — Telephone Encounter (Signed)
[facility] and [facility].

## 2018-02-07 NOTE — Telephone Encounter (Signed)
My chart message sent. Unable to reach patient after several attempts.

## 2018-03-14 ENCOUNTER — Other Ambulatory Visit: Payer: Self-pay | Admitting: *Deleted

## 2018-03-14 DIAGNOSIS — E118 Type 2 diabetes mellitus with unspecified complications: Secondary | ICD-10-CM

## 2018-03-14 MED ORDER — GLIMEPIRIDE 4 MG PO TABS: 4.0000 mg | ORAL_TABLET | Freq: Every day | ORAL | 1 refills | Status: DC

## 2018-03-14 NOTE — Telephone Encounter (Signed)
Rx recorded from 02/03/18 was marked as No Print so rx did not go to pharmacy. Rx eRxed today with same instructions, qty and RFs.

## 2018-05-04 ENCOUNTER — Ambulatory Visit: Payer: Managed Care, Other (non HMO) | Admitting: Podiatry

## 2018-05-04 ENCOUNTER — Encounter: Payer: Self-pay | Admitting: Podiatry

## 2018-05-04 DIAGNOSIS — M722 Plantar fascial fibromatosis: Secondary | ICD-10-CM | POA: Diagnosis not present

## 2018-05-04 MED ORDER — TRIAMCINOLONE ACETONIDE 10 MG/ML IJ SUSP
10.0000 mg | Freq: Once | INTRAMUSCULAR | Status: AC
  Administered 2018-05-04: 10 mg

## 2018-05-05 NOTE — Progress Notes (Signed)
Subjective:   Patient ID: Blake Davis, male   DOB: 42 y.o.   MRN: [id number]   HPI Patient states he was doing real well but has had a flareup recently of his heel pain and he does not remember specific injury   ROS      Objective:  Physical Exam  Neurovascular status intact with discomfort plantar aspect right heel with fluid buildup around the insertion into the calcaneus with patient's A1c running around 10     Assessment:  Plantar fasciitis right with high A1c     Plan:  H&P x-ray reviewed and today I did do a careful injection 3 mg Kenalog 5 mg Xylocaine and I advised on his A1c that I like him to get that down and I do want him to watch his sugar little more carefully over the next few days.  Reappoint if symptoms persist  X-rays indicate minimal spur with no indication of stress fracture arthritis

## 2018-05-08 ENCOUNTER — Ambulatory Visit: Payer: Managed Care, Other (non HMO) | Admitting: Family Medicine

## 2018-05-22 ENCOUNTER — Encounter: Payer: Self-pay | Admitting: Family Medicine

## 2018-05-22 ENCOUNTER — Ambulatory Visit: Payer: Managed Care, Other (non HMO) | Admitting: Family Medicine

## 2018-05-22 VITALS — BP 116/78 | HR 82 | Resp 16 | Ht 71.0 in | Wt 249.0 lb

## 2018-05-22 DIAGNOSIS — E1149 Type 2 diabetes mellitus with other diabetic neurological complication: Secondary | ICD-10-CM

## 2018-05-22 DIAGNOSIS — E118 Type 2 diabetes mellitus with unspecified complications: Secondary | ICD-10-CM

## 2018-05-22 DIAGNOSIS — E782 Mixed hyperlipidemia: Secondary | ICD-10-CM | POA: Diagnosis not present

## 2018-05-22 LAB — POCT GLYCOSYLATED HEMOGLOBIN (HGB A1C): HEMOGLOBIN A1C: 6.8 % — AB (ref 4.0–5.6)

## 2018-05-22 LAB — MICROALBUMIN / CREATININE URINE RATIO
CREATININE, U: 219.6 mg/dL
MICROALB UR: 1.3 mg/dL (ref 0.0–1.9)
MICROALB/CREAT RATIO: 0.6 mg/g (ref 0.0–30.0)

## 2018-05-22 MED ORDER — GLIMEPIRIDE 4 MG PO TABS: ORAL_TABLET | ORAL | 1 refills | Status: DC

## 2018-05-22 MED ORDER — METFORMIN HCL 1000 MG PO TABS: ORAL_TABLET | ORAL | 1 refills | Status: DC

## 2018-05-22 NOTE — Patient Instructions (Addendum)
meds the same except amaryl 1 in morning and 1/2 tab in the evening.  Start Lipitor.    Great job!!! a1c 6.8 goal is < 6.5.  F/U 4 mos   Diabetes Mellitus and Nutrition When you have diabetes (diabetes mellitus), it is very important to have healthy eating habits because your blood sugar (glucose) levels are greatly affected by what you eat and drink. Eating healthy foods in the appropriate amounts, at about the same times every day, can help you:  Control your blood glucose.  Lower your risk of heart disease.  Improve your blood pressure.  Reach or maintain a healthy weight.  Every person with diabetes is different, and each person has different needs for a meal plan. Your health care provider may recommend that you work with a diet and nutrition specialist (dietitian) to make a meal plan that is best for you. Your meal plan may vary depending on factors such as:  The calories you need.  The medicines you take.  Your weight.  Your blood glucose, blood pressure, and cholesterol levels.  Your activity level.  Other health conditions you have, such as heart or kidney disease.  How do carbohydrates affect me? Carbohydrates affect your blood glucose level more than any other type of food. Eating carbohydrates naturally increases the amount of glucose in your blood. Carbohydrate counting is a method for keeping track of how many carbohydrates you eat. Counting carbohydrates is important to keep your blood glucose at a healthy level, especially if you use insulin or take certain oral diabetes medicines. It is important to know how many carbohydrates you can safely have in each meal. This is different for every person. Your dietitian can help you calculate how many carbohydrates you should have at each meal and for snack. Foods that contain carbohydrates include:  Bread, cereal, rice, pasta, and crackers.  Potatoes and corn.  Peas, beans, and lentils.  Milk and yogurt.  Fruit  and juice.  Desserts, such as cakes, cookies, ice cream, and candy.  How does alcohol affect me? Alcohol can cause a sudden decrease in blood glucose (hypoglycemia), especially if you use insulin or take certain oral diabetes medicines. Hypoglycemia can be a life-threatening condition. Symptoms of hypoglycemia (sleepiness, dizziness, and confusion) are similar to symptoms of having too much alcohol. If your health care provider says that alcohol is safe for you, follow these guidelines:  Limit alcohol intake to no more than 1 drink per day for nonpregnant women and 2 drinks per day for men. One drink equals 12 oz of beer, 5 oz of wine, or 1 oz of hard liquor.  Do not drink on an empty stomach.  Keep yourself hydrated with water, diet soda, or unsweetened iced tea.  Keep in mind that regular soda, juice, and other mixers may contain a lot of sugar and must be counted as carbohydrates.  What are tips for following this plan? Reading food labels  Start by checking the serving size on the label. The amount of calories, carbohydrates, fats, and other nutrients listed on the label are based on one serving of the food. Many foods contain more than one serving per package.  Check the total grams (g) of carbohydrates in one serving. You can calculate the number of servings of carbohydrates in one serving by dividing the total carbohydrates by 15. For example, if a food has 30 g of total carbohydrates, it would be equal to 2 servings of carbohydrates.  Check the number of grams (  g) of saturated and trans fats in one serving. Choose foods that have low or no amount of these fats.  Check the number of milligrams (mg) of sodium in one serving. Most people should limit total sodium intake to less than 2,300 mg per day.  Always check the nutrition information of foods labeled as "low-fat" or "nonfat". These foods may be higher in added sugar or refined carbohydrates and should be avoided.  Talk to  your dietitian to identify your daily goals for nutrients listed on the label. Shopping  Avoid buying canned, premade, or processed foods. These foods tend to be high in fat, sodium, and added sugar.  Shop around the outside edge of the grocery store. This includes fresh fruits and vegetables, bulk grains, fresh meats, and fresh dairy. Cooking  Use low-heat cooking methods, such as baking, instead of high-heat cooking methods like deep frying.  Cook using healthy oils, such as olive, canola, or sunflower oil.  Avoid cooking with butter, cream, or high-fat meats. Meal planning  Eat meals and snacks regularly, preferably at the same times every day. Avoid going long periods of time without eating.  Eat foods high in fiber, such as fresh fruits, vegetables, beans, and whole grains. Talk to your dietitian about how many servings of carbohydrates you can eat at each meal.  Eat 4-6 ounces of lean protein each day, such as lean meat, chicken, fish, eggs, or tofu. 1 ounce is equal to 1 ounce of meat, chicken, or fish, 1 egg, or 1/4 cup of tofu.  Eat some foods each day that contain healthy fats, such as avocado, nuts, seeds, and fish. Lifestyle   Check your blood glucose regularly.  Exercise at least 30 minutes 5 or more days each week, or as told by your health care provider.  Take medicines as told by your health care provider.  Do not use any products that contain nicotine or tobacco, such as cigarettes and e-cigarettes. If you need help quitting, ask your health care provider.  Work with a Veterinary surgeon or diabetes educator to identify strategies to manage stress and any emotional and social challenges. What are some questions to ask my health care provider?  Do I need to meet with a diabetes educator?  Do I need to meet with a dietitian?  What number can I call if I have questions?  When are the best times to check my blood glucose? Where to find more information:  American  Diabetes Association: diabetes.org/food-and-fitness/food  Academy of Nutrition and Dietetics: https://www.vargas.com/  [employer] of Diabetes and Digestive and Kidney Diseases (NIH): FindJewelers.cz Summary  A healthy meal plan will help you control your blood glucose and maintain a healthy lifestyle.  Working with a diet and nutrition specialist (dietitian) can help you make a meal plan that is best for you.  Keep in mind that carbohydrates and alcohol have immediate effects on your blood glucose levels. It is important to count carbohydrates and to use alcohol carefully. This information is not intended to replace advice given to you by your health care provider. Make sure you discuss any questions you have with your health care provider. Document Released: 04/08/2005 Document Revised: 08/16/2016 Document Reviewed: 08/16/2016 Elsevier Interactive Patient Education  [employer].

## 2018-05-22 NOTE — Progress Notes (Signed)
Patient ID: Blake Davis, male   DOB: February 17, 1976, 42 y.o.   MRN: [id number]    Blake Davis , [birth year], 42 y.o., male MRN: [id number]  Chief Complaint  Patient presents with  . Diabetes    follow up    Subjective:  Diabetes type 2 with retinopathy/HLD:  Pt reports compliance with metformin 1000 mg BID, Amaryl 4 mg only (told 1 am- 1/2 evening), Januvia. Patient denies dizziness, hyperglycemic or hypoglycemic events. Patient denies numbness, tingling in the extremities or nonhealing wounds of feet.  He has very mild chronic neuropathy.  He was prescribed statin and is not taking. He forgot.  PNA series: series now completed. 04/04/2017 Flu shot: 2019 UTD (recommneded yearly) BMP: 02/03/2018 WNL (w/ exception of sugar 372) Microalb: collected today.  Foot exam: 07/2017--> completed today, he has podiatry appt this week for yearly check.  Eye exam:  08/2017 Dr. Eloisa Northern Aziem--> retinopathy A1c: 7.3--> 6.4---> 7.6--> 7.9--> 7.9--> 8.5--> 10.3--> 6.8  today     Lab Results  Component Value Date   HGBA1C 6.8 (A) 05/22/2018    Past Medical History:  Diagnosis Date  . Diabetes mellitus without complication (HCC)   . Visual changes 10/2015   Diabetic changes   Past Surgical History:  Procedure Laterality Date  . FOOT SURGERY  1988   mole/nevus  . NASAL SEPTUM SURGERY  1999   deviated   Family History  Problem Relation Age of Onset  . Diabetes Mother   . Heart disease Mother   . Heart attack Mother   . Alcohol abuse Paternal Uncle   . Dementia Paternal Grandmother   . Diabetes Paternal Grandfather   . Stroke Paternal Grandfather   . Cancer Paternal Grandfather   . Prostate cancer Paternal Grandfather    Allergies as of 05/22/2018   No Known Allergies     Medication List        Accurate as of 05/22/18  8:17 AM. Always use your most recent med list.          atorvastatin 20 MG tablet Commonly known as:  LIPITOR Take 1 tablet (20 mg total) by mouth daily.     glimepiride 4 MG tablet Commonly known as:  AMARYL Take 1 tablet (4 mg total) by mouth daily with breakfast for 7 days.   metFORMIN 1000 MG tablet Commonly known as:  GLUCOPHAGE 1000 mg BID with food   onetouch ultrasoft lancets   ONETOUCH VERIO test strip Generic drug:  glucose blood   sitaGLIPtin 100 MG tablet Commonly known as:  JANUVIA Take 1 tablet (100 mg total) by mouth daily.       ROS: Negative, with the exception of above mentioned in HPI  Objective:  BP 116/78 (BP Location: Left Arm, Patient Position: Sitting, Cuff Size: Large)   Pulse 82   Resp 16   Ht 5\' 11"  (1.803 m)   Wt 249 lb (112.9 kg)   SpO2 98%   BMI 34.73 kg/m  Body mass index is 34.73 kg/m. Gen: Afebrile. No acute distress. Nontoxic. Obese male.  HENT: AT. [place].  MMM.  Eyes:Pupils Equal Round Reactive to light, Extraocular movements intact,  Conjunctiva without redness, discharge or icterus. CV: RRR, no edema, +2/4 P posterior tibialis pulses Chest: CTAB, no wheeze or crackles Abd: Soft.obese. NTND. BS present. no Masses palpated.  Skin: no rashes, purpura or petechiae.  Neuro:  Normal gait. PERLA. EOMi. Alert. Oriented x3    Results for orders placed or performed in visit on  05/22/18 (from the past 24 hour(s))  POCT HgB A1C     Status: Abnormal   Collection Time: 05/22/18  8:16 AM  Result Value Ref Range   Hemoglobin A1C 6.8 (A) 4.0 - 5.6 %   HbA1c POC (<> result, manual entry)     HbA1c, POC (prediabetic range)     HbA1c, POC (controlled diabetic range)       Assessment/Plan: Blake Davis is a 42 y.o. male present for OV for  Type 2 diabetes mellitus with complication, without long-term current use of insulin (HCC) Other diabetic neurological complication associated with type 2 diabetes mellitus (HCC) /Morbid obesity (HCC) hyperlipidemia - MUCH better control and lost a few pounds. Great job.  - Continue metformin and januvia. Still did not taper the amaryl as instructed. Again  asked him take Amaryl 4 mg in am and 1/2 tab in evening before dinner.  - start Lipitor and lipid/cmp collection next visit. He forgot to pick up med after last visit.  PNA series: series now completed. 04/04/2017 Flu shot: 2019 UTD (recommneded yearly) BMP: 02/03/2018 WNL (w/ exception of sugar 372) Microalb: collected today.  Foot exam: 07/2017--> completed today, he has podiatry appt this week for yearly check.  Eye exam:  08/2017 Dr. Eloisa Northern Aziem--> retinopathy A1c: 7.3--> 6.4---> 7.6--> 7.9--> 7.9--> 8.5--> 10.3--> 6.8  today    > 15 minutes spent with patient, >50% of time spent face to face counseling    electronically signed by:  Felix Pacini, DO  [facility] - [place]

## 2018-09-18 ENCOUNTER — Emergency Department (HOSPITAL_COMMUNITY): Payer: Managed Care, Other (non HMO)

## 2018-09-18 ENCOUNTER — Emergency Department (HOSPITAL_COMMUNITY)
Admission: EM | Admit: 2018-09-18 | Discharge: 2018-09-19 | Disposition: A | Discharge status: Home / Self Care | Payer: Managed Care, Other (non HMO) | Attending: Emergency Medicine | Admitting: Emergency Medicine

## 2018-09-18 ENCOUNTER — Encounter (HOSPITAL_COMMUNITY): Payer: Self-pay

## 2018-09-18 ENCOUNTER — Other Ambulatory Visit: Payer: Self-pay

## 2018-09-18 DIAGNOSIS — Y999 Unspecified external cause status: Secondary | ICD-10-CM | POA: Insufficient documentation

## 2018-09-18 DIAGNOSIS — L089 Local infection of the skin and subcutaneous tissue, unspecified: Secondary | ICD-10-CM | POA: Diagnosis not present

## 2018-09-18 DIAGNOSIS — Z7984 Long term (current) use of oral hypoglycemic drugs: Secondary | ICD-10-CM | POA: Diagnosis not present

## 2018-09-18 DIAGNOSIS — Y929 Unspecified place or not applicable: Secondary | ICD-10-CM | POA: Diagnosis not present

## 2018-09-18 DIAGNOSIS — E1140 Type 2 diabetes mellitus with diabetic neuropathy, unspecified: Secondary | ICD-10-CM | POA: Insufficient documentation

## 2018-09-18 DIAGNOSIS — S91331A Puncture wound without foreign body, right foot, initial encounter: Secondary | ICD-10-CM | POA: Diagnosis present

## 2018-09-18 DIAGNOSIS — W450XXA Nail entering through skin, initial encounter: Secondary | ICD-10-CM | POA: Diagnosis not present

## 2018-09-18 DIAGNOSIS — Y939 Activity, unspecified: Secondary | ICD-10-CM | POA: Diagnosis not present

## 2018-09-18 DIAGNOSIS — Z23 Encounter for immunization: Secondary | ICD-10-CM | POA: Diagnosis not present

## 2018-09-18 NOTE — ED Triage Notes (Signed)
Pt stepped on nail earlier today in right foot. Unsure if all the nail came out. DM2, unknown tetanus.

## 2018-09-19 MED ORDER — AMOXICILLIN-POT CLAVULANATE 875-125 MG PO TABS: 1.0000 | ORAL_TABLET | Freq: Two times a day (BID) | ORAL | 0 refills | Status: DC

## 2018-09-19 MED ORDER — AMOXICILLIN-POT CLAVULANATE 875-125 MG PO TABS
1.0000 | ORAL_TABLET | Freq: Once | ORAL | Status: AC
  Administered 2018-09-19: 1 via ORAL
  Filled 2018-09-19: qty 1

## 2018-09-19 MED ORDER — SULFAMETHOXAZOLE-TRIMETHOPRIM 800-160 MG PO TABS
1.0000 | ORAL_TABLET | Freq: Once | ORAL | Status: AC
  Administered 2018-09-19: 1 via ORAL
  Filled 2018-09-19: qty 1

## 2018-09-19 MED ORDER — TETANUS-DIPHTH-ACELL PERTUSSIS 5-2.5-18.5 LF-MCG/0.5 IM SUSP
0.5000 mL | Freq: Once | INTRAMUSCULAR | Status: AC
  Administered 2018-09-19: 0.5 mL via INTRAMUSCULAR
  Filled 2018-09-19: qty 0.5

## 2018-09-19 MED ORDER — SULFAMETHOXAZOLE-TRIMETHOPRIM 800-160 MG PO TABS: 1.0000 | ORAL_TABLET | Freq: Two times a day (BID) | ORAL | 0 refills | Status: AC

## 2018-09-19 NOTE — ED Notes (Signed)
ED Provider at bedside. 

## 2018-09-19 NOTE — ED Provider Notes (Signed)
[facility] EMERGENCY DEPARTMENT Provider Note   CSN: [id number] Arrival date & time: 09/18/18  2301    History   Chief Complaint Chief Complaint  Patient presents with  . Wound Check    HPI Blake Davis is a 43 y.o. male.     Patient presents to the emergency department for evaluation of puncture wound to the bottom of his right foot.  Patient reports that he stepped on a nail earlier today.  He initially did not feel the puncture because he has diabetes.  Later in the day he started to notice that his foot hurt and when he looked at it he saw the puncture wound.  He estimates that the wound occurred approximately 14 hours ago.  He tried to clean the area at home and has put Neosporin over it.     Past Medical History:  Diagnosis Date  . Diabetes mellitus without complication (HCC)   . Visual changes 10/2015   Diabetic changes    Patient Active Problem List   Diagnosis Date Noted  . Mixed hyperlipidemia 05/22/2018  . Foot callus 02/03/2018  . Morbid obesity (HCC) 04/04/2017  . Type 2 diabetes mellitus with complication, without long-term current use of insulin (HCC) 09/06/2016  . Diabetic neuropathy (HCC) 06/07/2016  . Erectile dysfunction 12/02/2015    Past Surgical History:  Procedure Laterality Date  . FOOT SURGERY  1988   mole/nevus  . NASAL SEPTUM SURGERY  1999   deviated        Home Medications    Prior to Admission medications   Medication Sig Start Date End Date Taking? Authorizing Provider  amoxicillin-clavulanate (AUGMENTIN) 875-125 MG tablet Take 1 tablet by mouth 2 (two) times daily. 09/19/18   Gilda Crease, MD  atorvastatin (LIPITOR) 20 MG tablet Take 1 tablet (20 mg total) by mouth daily. Patient not taking: Reported on 05/22/2018 02/03/18   Felix Pacini A, DO  glimepiride (AMARYL) 4 MG tablet Amaryl 1 tab morning and 1/2 tab evening before meal. 05/22/18   Kuneff, Renee A, DO  Lancets (ONETOUCH ULTRASOFT)  lancets  12/04/15   [provider]  metFORMIN (GLUCOPHAGE) 1000 MG tablet 1000 mg BID with food 05/22/18   Kuneff, Renee A, DO  ONETOUCH VERIO test strip  12/04/15   [provider]  sitaGLIPtin (JANUVIA) 100 MG tablet Take 1 tablet (100 mg total) by mouth daily. 11/02/17   Kuneff, Renee A, DO  sulfamethoxazole-trimethoprim (BACTRIM DS,SEPTRA DS) 800-160 MG tablet Take 1 tablet by mouth 2 (two) times daily for 7 days. 09/19/18 09/26/18  Gilda Crease, MD    Family History Family History  Problem Relation Age of Onset  . Diabetes Mother   . Heart disease Mother   . Heart attack Mother   . Alcohol abuse Paternal Uncle   . Dementia Paternal Grandmother   . Diabetes Paternal Grandfather   . Stroke Paternal Grandfather   . Cancer Paternal Grandfather   . Prostate cancer Paternal Grandfather     Social History Social History   Tobacco Use  . Smoking status: Never Smoker  . Smokeless tobacco: Never Used  Substance Use Topics  . Alcohol use: No    Alcohol/week: 0.0 standard drinks  . Drug use: No     Allergies   Patient has no known allergies.   Review of Systems Review of Systems  Constitutional: Negative for fever.  Skin: Positive for wound.     Physical Exam Updated Vital Signs BP Marland Kitchen)  155/80 (BP Location: Right Arm)   Pulse (!) 104   Temp 98.3 F (36.8 C) (Oral)   Resp 16   SpO2 97%   Physical Exam Vitals signs and nursing note reviewed.  HENT:     Head: Atraumatic.  Pulmonary:     Effort: Pulmonary effort is normal.  Musculoskeletal: Normal range of motion.        General: No swelling or deformity.  Skin:    General: Skin is warm and dry.     Comments: Small puncture wound over first MTP joint on plantar aspect of right foot, no surrounding erythema, induration, no drainage.  Neurological:     General: No focal deficit present.     Mental Status: He is alert.  Psychiatric:        Mood and Affect: Mood normal.      ED  Treatments / Results  Labs (all labs ordered are listed, but only abnormal results are displayed) Labs Reviewed - No data to display  EKG None  Radiology Dg Foot Complete Right  Result Date: 09/19/2018 CLINICAL DATA:  43 year old male stepped on a nail. EXAM: RIGHT FOOT COMPLETE - 3+ VIEW COMPARISON:  Right foot radiograph dated 08/12/2017 FINDINGS: There is no evidence of fracture or dislocation. There is no evidence of arthropathy or other focal bone abnormality. Soft tissues are unremarkable. IMPRESSION: Negative. Electronically Signed   By: Elgie Collard M.D.   On: 09/19/2018 00:15    Procedures Procedures (including critical care time)  Medications Ordered in ED Medications  Tdap (BOOSTRIX) injection 0.5 mL (has no administration in time range)  amoxicillin-clavulanate (AUGMENTIN) 875-125 MG per tablet 1 tablet (has no administration in time range)  sulfamethoxazole-trimethoprim (BACTRIM DS,SEPTRA DS) 800-160 MG per tablet 1 tablet (has no administration in time range)     Initial Impression / Assessment and Plan / ED Course  I have reviewed the triage vital signs and the nursing notes.  Pertinent labs & imaging results that were available during my care of the patient were reviewed by me and considered in my medical decision making (see chart for details).        Patient presents with a puncture wound on the plantar aspect of his foot.  He is a diabetic.  There is no sign of infection at this time.  X-ray does not show any foreign body.  No palpable foreign body.  I do not feel that the patient would benefit from debridements or opening the area at this time, but will update his tetanus and provide empiric antibiotic coverage.  He has follow-up with his primary care doctor scheduled in 4 days.  He will return to the ER if there are any signs of infection.  Final Clinical Impressions(s) / ED Diagnoses   Final diagnoses:  Puncture wound of plantar aspect of right foot  with infection, initial encounter    ED Discharge Orders         Ordered    amoxicillin-clavulanate (AUGMENTIN) 875-125 MG tablet  2 times daily     09/19/18 0236    sulfamethoxazole-trimethoprim (BACTRIM DS,SEPTRA DS) 800-160 MG tablet  2 times daily     09/19/18 0236           Gilda Crease, MD 09/19/18 0236

## 2018-09-22 ENCOUNTER — Encounter: Payer: Self-pay | Admitting: Family Medicine

## 2018-09-22 ENCOUNTER — Ambulatory Visit: Payer: Managed Care, Other (non HMO) | Admitting: Family Medicine

## 2018-09-22 VITALS — BP 121/73 | HR 77 | Temp 98.3°F | Resp 17 | Ht 71.0 in | Wt 253.2 lb

## 2018-09-22 DIAGNOSIS — E782 Mixed hyperlipidemia: Secondary | ICD-10-CM | POA: Diagnosis not present

## 2018-09-22 DIAGNOSIS — E118 Type 2 diabetes mellitus with unspecified complications: Secondary | ICD-10-CM

## 2018-09-22 DIAGNOSIS — E1149 Type 2 diabetes mellitus with other diabetic neurological complication: Secondary | ICD-10-CM | POA: Diagnosis not present

## 2018-09-22 DIAGNOSIS — S99921A Unspecified injury of right foot, initial encounter: Secondary | ICD-10-CM

## 2018-09-22 HISTORY — DX: Unspecified injury of right foot, initial encounter: S99.921A

## 2018-09-22 LAB — POCT GLYCOSYLATED HEMOGLOBIN (HGB A1C)
HBA1C, POC (PREDIABETIC RANGE): 7.6 % — AB (ref 5.7–6.4)
HbA1c POC (<> result, manual entry): 7.6 % (ref 4.0–5.6)
HbA1c, POC (controlled diabetic range): 7.6 % — AB (ref 0.0–7.0)
Hemoglobin A1C: 7.6 % — AB (ref 4.0–5.6)

## 2018-09-22 MED ORDER — ATORVASTATIN CALCIUM 20 MG PO TABS: 20.0000 mg | ORAL_TABLET | Freq: Every day | ORAL | 3 refills | Status: DC

## 2018-09-22 MED ORDER — METFORMIN HCL 1000 MG PO TABS: ORAL_TABLET | ORAL | 1 refills | Status: DC

## 2018-09-22 MED ORDER — SITAGLIPTIN PHOSPHATE 100 MG PO TABS: 100.0000 mg | ORAL_TABLET | Freq: Every day | ORAL | 3 refills | Status: DC

## 2018-09-22 MED ORDER — GLIMEPIRIDE 4 MG PO TABS: 4.0000 mg | ORAL_TABLET | Freq: Two times a day (BID) | ORAL | 1 refills | Status: DC

## 2018-09-22 NOTE — Addendum Note (Signed)
Addended by: Smitty Knudsen on: 09/22/2018 09:16 AM   Modules accepted: Orders

## 2018-09-22 NOTE — Progress Notes (Signed)
Patient ID: Blake Davis, male   DOB: 03/02/1976, 43 y.o.   MRN: [id number]    Blake Davis , [birth year], 43 y.o., male MRN: [id number]  Chief Complaint  Patient presents with  . Diabetes    Fasting. Does not check sugars at home.   . Foot Injury    R foot stepped on nail Monday. Went to [facility] ED. Tetanus shot and 2 ABX given     Subjective:  Diabetes type 2 with retinopathy/HLD: New foot injury Pt reports compliance with metformin 1000 mg BID, Amaryl 4/2 mg BID, Januvia. Patient denies dizziness, hyperglycemic or hypoglycemic events. Patient denies numbness, tingling in the extremities or nonhealing wounds of feet--> he did have a recent injury- stepped on a nail. Tx at ED with Td and abx (bactrim-augmentin).  He has very mild chronic neuropathy.  He was prescribed statin and is not taking. He forgot.  PNA series: series now completed. 04/04/2017 Flu shot: 2019 UTD (recommneded yearly) BMP: 02/03/2018 WNL (w/ exception of sugar 372) Microalb: collected today.  Foot exam: 09/22/2018 Eye exam:  08/2017 Dr. Eloisa Northern Aziem--> retinopathy--> encouraged to schedule.  A1c: 7.3--> 6.4---> 7.6--> 7.9--> 7.9--> 8.5--> 10.3--> 6.8--> 7.6  today    Lab Results  Component Value Date   HGBA1C 6.8 (A) 05/22/2018    Past Medical History:  Diagnosis Date  . Diabetes mellitus without complication (HCC)   . Visual changes 10/2015   Diabetic changes   Past Surgical History:  Procedure Laterality Date  . FOOT SURGERY  1988   mole/nevus  . NASAL SEPTUM SURGERY  1999   deviated   Family History  Problem Relation Age of Onset  . Diabetes Mother   . Heart disease Mother   . Heart attack Mother   . Alcohol abuse Paternal Uncle   . Dementia Paternal Grandmother   . Diabetes Paternal Grandfather   . Stroke Paternal Grandfather   . Cancer Paternal Grandfather   . Prostate cancer Paternal Grandfather    Allergies as of 09/22/2018   No Known Allergies     Medication List       Accurate as of September 22, 2018  8:31 AM. Always use your most recent med list.        amoxicillin-clavulanate 875-125 MG tablet Commonly known as:  AUGMENTIN Take 1 tablet by mouth 2 (two) times daily.   atorvastatin 20 MG tablet Commonly known as:  LIPITOR Take 1 tablet (20 mg total) by mouth daily.   glimepiride 4 MG tablet Commonly known as:  AMARYL Amaryl 1 tab morning and 1/2 tab evening before meal.   metFORMIN 1000 MG tablet Commonly known as:  GLUCOPHAGE 1000 mg BID with food   onetouch ultrasoft lancets   ONETOUCH VERIO test strip Generic drug:  glucose blood   sitaGLIPtin 100 MG tablet Commonly known as:  JANUVIA Take 1 tablet (100 mg total) by mouth daily.   sulfamethoxazole-trimethoprim 800-160 MG tablet Commonly known as:  BACTRIM DS,SEPTRA DS Take 1 tablet by mouth 2 (two) times daily for 7 days.       ROS: Negative, with the exception of above mentioned in HPI  Objective:  BP 121/73 (BP Location: Right Arm, Patient Position: Sitting, Cuff Size: Normal)   Pulse 77   Temp 98.3 F (36.8 C) (Oral)   Resp 17   Ht 5\' 11"  (1.803 m)   Wt 253 lb 4 oz (114.9 kg)   SpO2 98%   BMI 35.32 kg/m  Body mass index  is 35.32 kg/m. Gen: Afebrile. No acute distress. Nontoxic. Obese.  HENT: AT. .  MMM.  Eyes:Pupils Equal Round Reactive to light, Extraocular movements intact,  Conjunctiva without redness, discharge or icterus. CV: RRR no murmur, no edema, +2/4 P posterior tibialis pulses Chest: CTAB, no wheeze or crackles Neuro: Normal gait. PERLA. EOMi. Alert. Oriented x3  Psych: Normal affect, dress and demeanor. Normal speech. Normal thought content and judgment. Diabetic Foot Exam - Simple   Simple Foot Form Diabetic Foot exam was performed with the following findings:  Yes 09/22/2018  9:06 AM  Visual Inspection See comments:  Yes Sensation Testing Intact to touch and monofilament testing bilaterally:  Yes Pulse Check Posterior Tibialis and Dorsalis  pulse intact bilaterally:  Yes Comments Long thick nails. Nail injury healing well right 1st metatarsal head/plantar aspect. No erythema, swelling or drainage.      No results found for this or any previous visit (from the past 24 hour(s)).  Assessment/Plan: Blake Davis is a 43 y.o. male present for OV for  Type 2 diabetes mellitus with complication, without long-term current use of insulin (HCC) Other diabetic neurological complication associated with type 2 diabetes mellitus (HCC) /Morbid obesity (HCC) Hyperlipidemia Recent foot injury- healing well.  - increase in a1c over holidays.  - continue metformin 1000 mg BID.  - continue januvia 100 QD - Increase amaryl to 8 mg total daily- may take as BID if desired.  - continue statin- tolerating now- lipids and CMP today- he is fasting PNA series: series now completed. 04/04/2017 Flu shot: 2019 UTD (recommneded yearly) BMP: 02/03/2018 WNL (w/ exception of sugar 372) Microalb: UTD Foot exam: 09/22/2018 Eye exam:  08/2017 Dr. Eloisa Northern Aziem--> retinopathy--> encouraged to schedule.  A1c: 7.3--> 6.4---> 7.6--> 7.9--> 7.9--> 8.5--> 10.3--> 6.8--> 7.6  today    > 25 minutes spent with patient, >50% of time spent face to face counseling     electronically signed by:  Felix Pacini, DO  [facility] - [place]

## 2018-09-22 NOTE — Patient Instructions (Addendum)
a1c 7.6 today. It did go up.  Metformin and januvia dose keep the same.  Amaryl 1 tab twice a day now.  remember to get your eye exam.  Start a multivitamin.   Also start a probiotic while on the antibiotics for your foot.    Follow up in 3-4 months.    Please help [place] help you:  We are honored you have chosen [facility] for your Primary [facility] home. Below you will find basic instructions that you may need to access in the future. Please help [place] help you by reading the instructions, which cover many of the frequent questions we experience.   Prescription refills and request:  -In order to allow more efficient response time, please call your pharmacy for all refills. They will forward the request electronically to [place]. This allows for the quickest possible response. Request left on a nurse line can take longer to refill, since these are checked as time allows between office patients and other phone calls.  - refill request can take up to 3-5 working days to complete.  - If request is sent electronically and request is appropiate, it is usually completed in 1-2 business days.  - all patients will need to be seen routinely for all chronic medical conditions requiring prescription medications (see follow-up below). If you are overdue for follow up on your condition, you will be asked to make an appointment and we will call in enough medication to cover you until your appointment (up to 30 days).  - all controlled substances will require a face to face visit to request/refill.  - if you desire your prescriptions to go through a new pharmacy, and have an active script at original pharmacy, you will need to call your pharmacy and have scripts transferred to new pharmacy. This is completed between the pharmacy locations and not by your provider.    Results: If any images or labs were ordered, it can take up to 1 week to get results depending on the test ordered and the lab/facility running and  resulting the test. - Normal or stable results, which do not need further discussion, may be released to your mychart immediately with attached note to you. A call may not be generated for normal results. Please make certain to sign up for mychart. If you have questions on how to activate your mychart you can call the front office.  - If your results need further discussion, our office will attempt to contact you via phone, and if unable to reach you after 2 attempts, we will release your abnormal result to your mychart with instructions.  - All results will be automatically released in mychart after 1 week.  - Your provider will provide you with explanation and instruction on all relevant material in your results. Please keep in mind, results and labs may appear confusing or abnormal to the untrained eye, but it does not mean they are actually abnormal for you personally. If you have any questions about your results that are not covered, or you desire more detailed explanation than what was provided, you should make an appointment with your provider to do so.   Our office handles many outgoing and incoming calls daily. If we have not contacted you within 1 week about your results, please check your mychart to see if there is a message first and if not, then contact our office.  In helping with this matter, you help decrease call volume, and therefore allow [place] to  be able to respond to patients needs more efficiently.   Acute office visits (sick visit):  An acute visit is intended for a new problem and are scheduled in shorter time slots to allow schedule openings for patients with new problems. This is the appropriate visit to discuss a new problem. Problems will not be addressed by phone call or Echart message. Appointment is needed if requesting treatment. In order to provide you with excellent quality medical care with proper time for you to explain your problem, have an exam and receive treatment with  instructions, these appointments should be limited to one new problem per visit. If you experience a new problem, in which you desire to be addressed, please make an acute office visit, we save openings on the schedule to accommodate you. Please do not save your new problem for any other type of visit, let us take care of it properly and quickly for you.   Follow up visits:  Depending on your condition(s) your provider will need to see you routinely in order to provide you with quality care and prescribe medication(s). Most chronic conditions (Example: hypertension, Diabetes, depression/anxiety... etc), require visits a couple times a year. Your provider will instruct you on proper follow up for your personal medical conditions and history. Please make certain to make follow up appointments for your condition as instructed. Failing to do so could result in lapse in your medication treatment/refills. If you request a refill, and are overdue to be seen on a condition, we will always provide you with a 30 day script (once) to allow you time to schedule.    Medicare wellness (well visit): - we have a wonderful Nurse Maudie Mercury), that will meet with you and provide you will yearly medicare wellness visits. These visits should occur yearly (can not be scheduled less than 1 calendar year apart) and cover preventive health, immunizations, advance directives and screenings you are entitled to yearly through your medicare benefits. Do not miss out on your entitled benefits, this is when medicare will pay for these benefits to be ordered for you.  These are strongly encouraged by your provider and is the appropriate type of visit to make certain you are up to date with all preventive health benefits. If you have not had your medicare wellness exam in the last 12 months, please make certain to schedule one by calling the office and schedule your medicare wellness with Maudie Mercury as soon as possible.   Yearly physical (well visit):   - Adults are recommended to be seen yearly for physicals. Check with your insurance and date of your last physical, most insurances require one calendar year between physicals. Physicals include all preventive health topics, screenings, medical exam and labs that are appropriate for gender/age and history. You may have fasting labs needed at this visit. This is a well visit (not a sick visit), new problems should not be covered during this visit (see acute visit).  - Pediatric patients are seen more frequently when they are younger. Your provider will advise you on well child visit timing that is appropriate for your their age. - This is not a medicare wellness visit. Medicare wellness exams do not have an exam portion to the visit. Some medicare companies allow for a physical, some do not allow a yearly physical. If your medicare allows a yearly physical you can schedule the medicare wellness with our nurse Maudie Mercury and have your physical with your provider after, on the same day. Please check with  insurance for your full benefits.   Late Policy/No Shows:  - all new patients should arrive 15-30 minutes earlier than appointment to allow [place] time  to  obtain all personal demographics,  insurance information and for you to complete office paperwork. - All established patients should arrive 10-15 minutes earlier than appointment time to update all information and be checked in .  - In our best efforts to run on time, if you are late for your appointment you will be asked to either reschedule or if able, we will work you back into the schedule. There will be a wait time to work you back in the schedule,  depending on availability.  - If you are unable to make it to your appointment as scheduled, please call 24 hours ahead of time to allow [place] to fill the time slot with someone else who needs to be seen. If you do not cancel your appointment ahead of time, you may be charged a no show fee.'

## 2018-10-02 ENCOUNTER — Other Ambulatory Visit (INDEPENDENT_AMBULATORY_CARE_PROVIDER_SITE_OTHER): Payer: Managed Care, Other (non HMO)

## 2018-10-02 DIAGNOSIS — E782 Mixed hyperlipidemia: Secondary | ICD-10-CM

## 2018-10-02 DIAGNOSIS — E118 Type 2 diabetes mellitus with unspecified complications: Secondary | ICD-10-CM | POA: Diagnosis not present

## 2018-10-02 LAB — COMPREHENSIVE METABOLIC PANEL
ALK PHOS: 38 U/L — AB (ref 39–117)
ALT: 29 U/L (ref 0–53)
AST: 17 U/L (ref 0–37)
Albumin: 4.4 g/dL (ref 3.5–5.2)
BUN: 13 mg/dL (ref 6–23)
CALCIUM: 9.6 mg/dL (ref 8.4–10.5)
CO2: 28 mEq/L (ref 19–32)
Chloride: 103 mEq/L (ref 96–112)
Creatinine, Ser: 1 mg/dL (ref 0.40–1.50)
GFR: 80.96 mL/min (ref >60.00)
Glucose, Bld: 177 mg/dL — ABNORMAL HIGH (ref 70–99)
Potassium: 4.8 mEq/L (ref 3.5–5.1)
Sodium: 138 mEq/L (ref 135–145)
Total Bilirubin: 0.4 mg/dL (ref 0.2–1.2)
Total Protein: 7 g/dL (ref 6.0–8.3)

## 2018-10-02 LAB — LIPID PANEL
Cholesterol: 130 mg/dL (ref 0–200)
HDL: 36.7 mg/dL — ABNORMAL LOW (ref >39.00)
LDL Cholesterol: 71 mg/dL (ref 0–99)
NonHDL: 93.59
Total CHOL/HDL Ratio: 4
Triglycerides: 111 mg/dL (ref 0.0–149.0)
VLDL: 22.2 mg/dL (ref 0.0–40.0)

## 2018-10-03 ENCOUNTER — Encounter: Payer: Self-pay | Admitting: *Deleted

## 2018-11-17 ENCOUNTER — Other Ambulatory Visit: Payer: Self-pay | Admitting: Family Medicine

## 2018-11-17 DIAGNOSIS — E118 Type 2 diabetes mellitus with unspecified complications: Secondary | ICD-10-CM

## 2018-12-25 DIAGNOSIS — N200 Calculus of kidney: Secondary | ICD-10-CM

## 2018-12-25 DIAGNOSIS — K831 Obstruction of bile duct: Secondary | ICD-10-CM

## 2018-12-25 DIAGNOSIS — K760 Fatty (change of) liver, not elsewhere classified: Secondary | ICD-10-CM

## 2018-12-25 HISTORY — DX: Obstruction of bile duct: K83.1

## 2018-12-25 HISTORY — DX: Calculus of kidney: N20.0

## 2018-12-25 HISTORY — DX: Fatty (change of) liver, not elsewhere classified: K76.0

## 2018-12-29 ENCOUNTER — Ambulatory Visit (INDEPENDENT_AMBULATORY_CARE_PROVIDER_SITE_OTHER): Payer: Managed Care, Other (non HMO) | Admitting: Family Medicine

## 2018-12-29 ENCOUNTER — Ambulatory Visit: Payer: Managed Care, Other (non HMO) | Admitting: Family Medicine

## 2018-12-29 ENCOUNTER — Encounter: Payer: Self-pay | Admitting: Family Medicine

## 2018-12-29 ENCOUNTER — Other Ambulatory Visit: Payer: Self-pay

## 2018-12-29 VITALS — BP 130/84 | HR 98 | Temp 97.3°F | Resp 16 | Ht 71.0 in | Wt 253.0 lb

## 2018-12-29 DIAGNOSIS — E1149 Type 2 diabetes mellitus with other diabetic neurological complication: Secondary | ICD-10-CM

## 2018-12-29 DIAGNOSIS — E118 Type 2 diabetes mellitus with unspecified complications: Secondary | ICD-10-CM | POA: Diagnosis not present

## 2018-12-29 DIAGNOSIS — E782 Mixed hyperlipidemia: Secondary | ICD-10-CM

## 2018-12-29 DIAGNOSIS — L089 Local infection of the skin and subcutaneous tissue, unspecified: Secondary | ICD-10-CM

## 2018-12-29 DIAGNOSIS — D696 Thrombocytopenia, unspecified: Secondary | ICD-10-CM | POA: Diagnosis not present

## 2018-12-29 LAB — CBC WITH DIFFERENTIAL/PLATELET
Basophils Absolute: 0 10*3/uL (ref 0.0–0.1)
Basophils Relative: 0.5 % (ref 0.0–3.0)
Eosinophils Absolute: 0.4 10*3/uL (ref 0.0–0.7)
Eosinophils Relative: 4.4 % (ref 0.0–5.0)
HCT: 46.4 % (ref 39.0–52.0)
Hemoglobin: 16.1 g/dL (ref 13.0–17.0)
Lymphocytes Relative: 26.1 % (ref 12.0–46.0)
Lymphs Abs: 2.3 10*3/uL (ref 0.7–4.0)
MCHC: 34.8 g/dL (ref 30.0–36.0)
MCV: 85.4 fl (ref 78.0–100.0)
Monocytes Absolute: 0.6 10*3/uL (ref 0.1–1.0)
Monocytes Relative: 6.6 % (ref 3.0–12.0)
Neutro Abs: 5.6 10*3/uL (ref 1.4–7.7)
Neutrophils Relative %: 62.4 % (ref 43.0–77.0)
Platelets: 219 10*3/uL (ref 150.0–400.0)
RBC: 5.43 Mil/uL (ref 4.22–5.81)
RDW: 13.1 % (ref 11.5–15.5)
WBC: 8.9 10*3/uL (ref 4.0–10.5)

## 2018-12-29 LAB — HEMOGLOBIN A1C: Hgb A1c MFr Bld: 8.7 % — ABNORMAL HIGH (ref 4.6–6.5)

## 2018-12-29 LAB — TSH: TSH: 5 u[IU]/mL — ABNORMAL HIGH (ref 0.35–4.50)

## 2018-12-29 MED ORDER — GLIMEPIRIDE 4 MG PO TABS: 8.0000 mg | ORAL_TABLET | Freq: Every day | ORAL | 1 refills | Status: DC

## 2018-12-29 MED ORDER — METFORMIN HCL 1000 MG PO TABS: ORAL_TABLET | ORAL | 1 refills | Status: DC

## 2018-12-29 NOTE — Progress Notes (Signed)
Patient ID: [name], male   DOB: 12/11/1975, 43 y.o.   MRN: [id number]    [name] , 01/31/1976, 43 y.o., male MRN: [id number]  Chief Complaint  Patient presents with  . Diabetes    Patient states that he is doing well overall.  Some pain in shoulders wants to know what he can do to ease that.  No other complaints.  Medication list reveiwed and correct per patient.    Patient Care Team    Relationship Specialty Notifications Start End  Natalia LeatherwoodKuneff, Paw Karstens A, DO PCP - General Family Medicine  12/02/15     Subjective:  Diabetes type 2 with retinopathy/HLD Pt reports compliance  with metformin 1000 mg BID, Amaryl 4/4 mg BID and  Januvia. Patient denies dizziness, hyperglycemic or hypoglycemic events. Patient denies numbness, tingling in the extremities or nonhealing wounds of feet. He has a chronic skin condition of right thumb, which is possibly fungal for skin condition he would like a second opinion on. He had seen a derm 1-2 years ago for this conditions, but does not which one.  He has very mild chronic neuropathy.  He is prescribed statin and reports compliance PNA series: series now completed. 04/04/2017 Flu shot: 2019 UTD (recommneded yearly) BMP: 10/02/2018 WNL Microalb: UTD 04/2018 Foot exam: 09/22/2018 Eye exam:  08/2017 Dr. Eloisa NorthernSana Aziem--> retinopathy--> encouraged to schedule.  A1c: 7.3--> 6.4---> 7.6--> 7.9--> 7.9--> 8.5--> 10.3--> 6.8--> 7.6 >> ordered today   Thrombocytopenia : new on lab work 01/2018>> platelets 92.  Prior collection was 2017 and platelets were 2003.  Lab Results  Component Value Date   HGBA1C 7.6 (A) 09/22/2018   HGBA1C 7.6 09/22/2018   HGBA1C 7.6 (A) 09/22/2018   HGBA1C 7.6 (A) 09/22/2018    Past Medical History:  Diagnosis Date  . Diabetes mellitus without complication (HCC)   . Visual changes 10/2015   Diabetic changes   Past Surgical History:  Procedure Laterality Date  . FOOT SURGERY  1988   mole/nevus  . NASAL SEPTUM SURGERY  1999   deviated   Family History  Problem Relation Age of Onset  . Diabetes Mother   . Heart disease Mother   . Heart attack Mother   . Alcohol abuse Paternal Uncle   . Dementia Paternal Grandmother   . Diabetes Paternal Grandfather   . Stroke Paternal Grandfather   . Cancer Paternal Grandfather   . Prostate cancer Paternal Grandfather    Allergies as of 12/29/2018   No Known Allergies     Medication List       Accurate as of December 29, 2018  8:21 AM. If you have any questions, ask your nurse or doctor.        amoxicillin-clavulanate 875-125 MG tablet Commonly known as:  Augmentin Take 1 tablet by mouth 2 (two) times daily.   atorvastatin 20 MG tablet Commonly known as:  LIPITOR Take 1 tablet (20 mg total) by mouth daily.   glimepiride 4 MG tablet Commonly known as:  AMARYL Take 2 tablets (8 mg total) by mouth daily with breakfast.   metFORMIN 1000 MG tablet Commonly known as:  GLUCOPHAGE 1000 mg BID with food   onetouch ultrasoft lancets   OneTouch Verio test strip Generic drug:  glucose blood   sitaGLIPtin 100 MG tablet Commonly known as:  JANUVIA Take 1 tablet (100 mg total) by mouth daily.       ROS: Negative, with the exception of above mentioned in HPI  Objective:  BP 130/84  Pulse 98   Temp (!) 97.3 F (36.3 C)   Resp 16   Ht 5\' 11"  (1.803 m)   Wt 253 lb (114.8 kg)   SpO2 98%   BMI 35.29 kg/m  Body mass index is 35.29 kg/m. Gen: Afebrile. No acute distress.  Nontoxic in presentation.  Pleasant, Caucasian, obese male. HENT: AT. [place].  MMM.  Eyes:Pupils Equal Round Reactive to light, Extraocular movements intact,  Conjunctiva without redness, discharge or icterus. Neck/lymp/endocrine: Supple, no lymphadenopathy, no thyromegaly CV: RRR murmur, no edema, +2/4 P posterior tibialis pulses Chest: CTAB, no wheeze or crackles Skin: Scaly/peeling right thumb pad, no purpura or petechiae.  Neuro: Normal gait. PERLA. EOMi. Alert. Oriented x3 Psych: Normal  affect, dress and demeanor. Normal speech. Normal thought content and judgment..   No results found for this or any previous visit (from the past 24 hour(s)).  Assessment/Plan: Blake Davis is a 43 y.o. male present for OV for  Type 2 diabetes mellitus with complication, without long-term current use of insulin (HCC) Other diabetic neurological complication associated with type 2 diabetes mellitus (HCC) /Morbid obesity (HCC) Hyperlipidemia -We will collect A1c and if not below 7 consider adding a SLGi.  - continue metformin 1000 mg BID.  - continue januvia 100 QD - continue amaryl to 8 mg total daily- may take as BID if desired.  - continue statin  PNA series: series now completed. 04/04/2017 Flu shot: 2019 UTD (recommneded yearly) BMP: 10/02/2018 WNL Microalb: UTD 04/2018 Foot exam: 09/22/2018 Eye exam:  08/2017 Dr. Eloisa Northern Aziem--> retinopathy--> encouraged to schedule.  A1c: 7.3--> 6.4---> 7.6--> 7.9--> 7.9--> 8.5--> 10.3--> 6.8--> 7.6 .  7.6>> ordered today   Skin infection: Referred to another dermatologist for second opinion on his right thumb skin condition.  Infection versus inflammatory skin condition.  > 25 minutes spent with patient, >50% of time spent face to face counseling   Orders Placed This Encounter  Procedures  . Hemoglobin A1c  . CBC with Differential/Platelet  . TSH  . Ambulatory referral to Dermatology     electronically signed by:  Felix Pacini, DO  [facility] - [place]

## 2018-12-29 NOTE — Patient Instructions (Signed)
Follow up in 4 mos.   We will call you with results.  Make your eye appt soon.     Diabetes Mellitus and Exercise Exercising regularly is important for your overall health, especially when you have diabetes (diabetes mellitus). Exercising is not only about losing weight. It has many other health benefits, such as increasing muscle strength and bone density and reducing body fat and stress. This leads to improved fitness, flexibility, and endurance, all of which result in better overall health. Exercise has additional benefits for people with diabetes, including:  Reducing appetite.  Helping to lower and control blood glucose.  Lowering blood pressure.  Helping to control amounts of fatty substances (lipids) in the blood, such as cholesterol and triglycerides.  Helping the body to respond better to insulin (improving insulin sensitivity).  Reducing how much insulin the body needs.  Decreasing the risk for heart disease by: ? Lowering cholesterol and triglyceride levels. ? Increasing the levels of good cholesterol. ? Lowering blood glucose levels. What is my activity plan? Your health care provider or certified diabetes educator can help you make a plan for the type and frequency of exercise (activity plan) that works for you. Make sure that you:  Do at least 150 minutes of moderate-intensity or vigorous-intensity exercise each week. This could be brisk walking, biking, or water aerobics. ? Do stretching and strength exercises, such as yoga or weightlifting, at least 2 times a week. ? Spread out your activity over at least 3 days of the week.  Get some form of physical activity every day. ? Do not go more than 2 days in a row without some kind of physical activity. ? Avoid being inactive for more than 30 minutes at a time. Take frequent breaks to walk or stretch.  Choose a type of exercise or activity that you enjoy, and set realistic goals.  Start slowly, and gradually increase  the intensity of your exercise over time. What do I need to know about managing my diabetes?   Check your blood glucose before and after exercising. ? If your blood glucose is 240 mg/dL (13.3 mmol/L) or higher before you exercise, check your urine for ketones. If you have ketones in your urine, do not exercise until your blood glucose returns to normal. ? If your blood glucose is 100 mg/dL (5.6 mmol/L) or lower, eat a snack containing 15-20 grams of carbohydrate. Check your blood glucose 15 minutes after the snack to make sure that your level is above 100 mg/dL (5.6 mmol/L) before you start your exercise.  Know the symptoms of low blood glucose (hypoglycemia) and how to treat it. Your risk for hypoglycemia increases during and after exercise. Common symptoms of hypoglycemia can include: ? Hunger. ? Anxiety. ? Sweating and feeling clammy. ? Confusion. ? Dizziness or feeling light-headed. ? Increased heart rate or palpitations. ? Blurry vision. ? Tingling or numbness around the mouth, lips, or tongue. ? Tremors or shakes. ? Irritability.  Keep a rapid-acting carbohydrate snack available before, during, and after exercise to help prevent or treat hypoglycemia.  Avoid injecting insulin into areas of the body that are going to be exercised. For example, avoid injecting insulin into: ? The arms, when playing tennis. ? The legs, when jogging.  Keep records of your exercise habits. Doing this can help you and your health care provider adjust your diabetes management plan as needed. Write down: ? Food that you eat before and after you exercise. ? Blood glucose levels before and after   you exercise. ? The type and amount of exercise you have done. ? When your insulin is expected to peak, if you use insulin. Avoid exercising at times when your insulin is peaking.  When you start a new exercise or activity, work with your health care provider to make sure the activity is safe for you, and to  adjust your insulin, medicines, or food intake as needed.  Drink plenty of water while you exercise to prevent dehydration or heat stroke. Drink enough fluid to keep your urine clear or pale yellow. Summary  Exercising regularly is important for your overall health, especially when you have diabetes (diabetes mellitus).  Exercising has many health benefits, such as increasing muscle strength and bone density and reducing body fat and stress.  Your health care provider or certified diabetes educator can help you make a plan for the type and frequency of exercise (activity plan) that works for you.  When you start a new exercise or activity, work with your health care provider to make sure the activity is safe for you, and to adjust your insulin, medicines, or food intake as needed. This information is not intended to replace advice given to you by your health care provider. Make sure you discuss any questions you have with your health care provider. Document Released: 10/02/2003 Document Revised: 01/20/2017 Document Reviewed: 12/22/2015 Elsevier Interactive Patient Education  2019 Elsevier Inc.  

## 2018-12-29 NOTE — Progress Notes (Signed)
Pre visit review using our clinic review tool, if applicable. No additional management support is needed unless otherwise documented below in the visit note. 

## 2019-01-01 ENCOUNTER — Telehealth: Payer: Self-pay | Admitting: Family Medicine

## 2019-01-01 MED ORDER — DAPAGLIFLOZIN PROPANEDIOL 5 MG PO TABS: 5.0000 mg | ORAL_TABLET | Freq: Every day | ORAL | 1 refills | Status: DC

## 2019-01-01 NOTE — Telephone Encounter (Signed)
Pt was called and given results, pt verbalized understanding  

## 2019-01-01 NOTE — Telephone Encounter (Signed)
Please inform patient the following information: His blood counts are normal.  His thyroid level is very mildly abnormal at 5 (NL4.5). This is not elevated enough to consider medication currently- but we will need to repeat lab at his next appt. To ensure it returns to normal.  His a1c went up from 7.6 to 8.7. Continue januvia, metformin and amaryl. Added another oral med called [place].  If not controlled on next appt with above regimen we will need to refer to endocrine or consider insulin start.

## 2019-01-17 ENCOUNTER — Ambulatory Visit (INDEPENDENT_AMBULATORY_CARE_PROVIDER_SITE_OTHER): Payer: Managed Care, Other (non HMO) | Admitting: Family Medicine

## 2019-01-17 ENCOUNTER — Telehealth: Payer: Self-pay | Admitting: Family Medicine

## 2019-01-17 ENCOUNTER — Encounter: Payer: Self-pay | Admitting: Family Medicine

## 2019-01-17 ENCOUNTER — Other Ambulatory Visit: Payer: Self-pay

## 2019-01-17 VITALS — Ht 71.0 in

## 2019-01-17 DIAGNOSIS — R112 Nausea with vomiting, unspecified: Secondary | ICD-10-CM | POA: Diagnosis not present

## 2019-01-17 DIAGNOSIS — N179 Acute kidney failure, unspecified: Secondary | ICD-10-CM | POA: Diagnosis not present

## 2019-01-17 DIAGNOSIS — N200 Calculus of kidney: Secondary | ICD-10-CM

## 2019-01-17 DIAGNOSIS — R748 Abnormal levels of other serum enzymes: Secondary | ICD-10-CM | POA: Diagnosis not present

## 2019-01-17 MED ORDER — GENERIC EXTERNAL MEDICATION
Status: DC
Start: ? — End: 2019-01-17

## 2019-01-17 MED ORDER — PROMETHAZINE HCL 25 MG RE SUPP: 25.0000 mg | Freq: Four times a day (QID) | RECTAL | 0 refills | Status: DC | PRN

## 2019-01-17 NOTE — Progress Notes (Signed)
VIRTUAL VISIT VIA VIDEO  I connected with Tomma Lightning on 01/17/19 at  4:15 PM EDT by a video enabled telemedicine application and verified that I am speaking with the correct person using two identifiers. Location patient: Home Location provider: [facility], Office Persons participating in the virtual visit: Patient, Dr. Raoul Pitch and R.Baker, LPN  I discussed the limitations of evaluation and management by telemedicine and the availability of in person appointments. The patient expressed understanding and agreed to proceed.   SUBJECTIVE Chief Complaint  Patient presents with  . Nausea    Pt dx with kidney stone sunday night and has pain and nausea since. Zofran is not helping with the nausea. Pt has vomitted once today. Unable to obtain vital signs.     HPI: KENDARIOUS GUDINO is a 43 y.o. male present for nausea.  Patient reports he was seen at [facility] in [place] 2 days ago and was found to have a kidney stone.  He states he has not passed the kidney stone as of yet.  Per EMR review 2 mm left ureteral nonobstructing kidney stone was found.  Patient was supplied with Zofran and Norco 5-325 mg every 8 hours as needed.  He reports he has been nauseated all day and vomit x1.  He states he has not been tolerating food, he has tolerated some liquids but is not staying hydrated.  He denies fever.  He is taking his pain medications.  He reports the Zofran is not working for his nausea he has even doubled up on the dosing. Was collected the ED 01/15/2019: WBC 11.5, hemoglobin 15.7, hematocrit 46.3, platelet 229, neutrophils 83.4%, glucose 306, sodium 136, potassium 5.3, chloride 105, CO2 23, BUN 22, creatinine 1.6, ALT 49, AST 22, alk phos 48, urinalysis with amber-colored urine, large glucose in his urine, moderate ketones, specific gravity 1.024, large blood present, pH 5.0, small amount of protein, positive urobilinogen, negative nitrites RBC greater than 50, K  oxalate crystals +1 ,.  Lipase 212  ROS: See pertinent positives and negatives per HPI. CT ABD 01/15/2019 FINDINGS:    ABDOMEN:    LUNG BASES/LOWER CHEST: Unremarkable.    KIDNEYS: There is a 2 mm calculus in the proximal left ureter,  but without significant left-sided hydronephrosis at this time.  There is a small residual left-sided intrarenal calculus also  present. Mild left perinephric stranding. Unremarkable right  kidney.    LIVER: Extensive hepatic steatosis.    GALLBLADDER: Cholelithiasis.    PANCREAS: Mild fatty atrophy, particular in the pancreatic head.    ADRENAL GLANDS: Unremarkable.    SPLEEN: Unremarkable.    GASTROINTESTINAL: No acute gastrointestinal tract abnormality.    OTHER: No fluid collections, ascites, or pneumoperitoneum. No  suspicious adenopathy.    PELVIS:    GENITOURINARY: Unremarkable.     BONES: Bones show no destructive bone lesion or acute osseous  abnormality.    OTHER: No lymphadenopathy or pelvic free fluid. No soft tissue  masses.    PACS     Patient Active Problem List   Diagnosis Date Noted  . Thrombocytopenia ([place]) 12/29/2018  . Injury of right foot 09/22/2018  . Mixed hyperlipidemia 05/22/2018  . Morbid obesity ([place]) 04/04/2017  . Type 2 diabetes mellitus with complication, without long-term current use of insulin ([place]) 09/06/2016  . Diabetic neuropathy ([place]) 06/07/2016  . Erectile dysfunction 12/02/2015    Social History   Tobacco Use  . Smoking status: Never Smoker  . Smokeless tobacco:  Never Used  Substance Use Topics  . Alcohol use: No    Alcohol/week: 0.0 standard drinks    Current Outpatient Medications:  .  atorvastatin (LIPITOR) 20 MG tablet, Take 1 tablet (20 mg total) by mouth daily., Disp: 90 tablet, Rfl: 3 .  dapagliflozin propanediol (FARXIGA) 5 MG TABS tablet, Take 5 mg by mouth daily., Disp: 90 tablet, Rfl: 1 .  glimepiride (AMARYL) 4 MG tablet, Take 2  tablets (8 mg total) by mouth daily with breakfast., Disp: 180 tablet, Rfl: 1 .  HYDROcodone-acetaminophen (NORCO/VICODIN) 5-325 MG tablet, Take 1 tablet by mouth every 6 (six) hours as needed for moderate pain., Disp: , Rfl:  .  Lancets (ONETOUCH ULTRASOFT) lancets, , Disp: , Rfl:  .  metFORMIN (GLUCOPHAGE) 1000 MG tablet, 1000 mg BID with food, Disp: 180 tablet, Rfl: 1 .  ondansetron (ZOFRAN) 4 MG tablet, Take 4 mg by mouth every 8 (eight) hours as needed for nausea or vomiting., Disp: , Rfl:  .  ONETOUCH VERIO test strip, , Disp: , Rfl:  .  sitaGLIPtin (JANUVIA) 100 MG tablet, Take 1 tablet (100 mg total) by mouth daily., Disp: 90 tablet, Rfl: 3  Current Facility-Administered Medications:  .  0.9 %  sodium chloride infusion, 500 mL, Intravenous, Once, Armbruster, Carlota Raspberry, MD  No Known Allergies  OBJECTIVE: Ht _0  (1.803 m)   BMI 35.29 kg/m  Gen: No acute distress. Nontoxic in appearance.  HENT: AT. [place].   Eyes: Conjunctiva without redness, discharge or icterus. Chest: Cough or shortness of breath not present on exam.  Neuro: Alert. Oriented x3   ASSESSMENT AND PLAN: ZEB RAWL is a 43 y.o. male present for  Non-intractable vomiting with nausea, unspecified vomiting type/Nephrolithiasis/AKI/elevated lipase Patient presents today with his wife via virtual visit to discuss his continue nausea and vomiting with his newly diagnosis of a 2 mm left utero kidney stone.  Emergency room note reviewed by EMR as well as CT and labs.  Discussed options with him and his wife today and has agreed to do Phenergan 25 mg suppository every 6 hours as needed.  This was called into the pharmacy in [place] for him.  It was checked that it was received by pharmacy. Patient was encouraged to use suppository.  Give suppository approximately 30 minutes to be effective and then start to hydrate.  Moving forward he should be hydrating to allow urine to be a very pale yellow in color and making  sure he has food on his stomach before taking his pain medications. It appears he has AKI with elevated creatinine above his normal and elevated pancreatic enzymes as well. He was encouraged to return to the emergency room in [place] if having any continued increase in pain, unable to tolerate p.o. or fever develops.  They report understanding of instructions.  > 25 minutes spent with patient, >50% of time spent face to face   Howard Pouch, DO 01/17/2019

## 2019-01-17 NOTE — Telephone Encounter (Signed)
Spoke with pts wife and Dr Raoul Pitch agreed to see pt as VV at 4:15pm

## 2019-01-17 NOTE — Telephone Encounter (Signed)
Wife called back. She has emailed me records from his ER visit. Nausea meds is not working. Zofran.  Can we schedule VV today at 4:15pm? She requested for another med for nausea.   Please advise.  Contact Mel

## 2019-01-17 NOTE — Telephone Encounter (Signed)
Patient's wife left a VM that patient is out of town. Went to ER. Diagnosed with kidney stone. Patient was given medication for nausea is not working. Patient would like to get a different medication.  I called the wife's cell back to schedule virtual visit, had to leave a voicemail.

## 2019-08-03 ENCOUNTER — Encounter: Payer: Self-pay | Admitting: Family Medicine

## 2019-08-03 ENCOUNTER — Ambulatory Visit: Payer: Managed Care, Other (non HMO) | Admitting: Family Medicine

## 2019-08-03 ENCOUNTER — Other Ambulatory Visit: Payer: Self-pay

## 2019-08-03 VITALS — BP 139/81 | HR 81 | Temp 97.6°F | Resp 16 | Ht 71.0 in | Wt 246.4 lb

## 2019-08-03 DIAGNOSIS — E118 Type 2 diabetes mellitus with unspecified complications: Secondary | ICD-10-CM

## 2019-08-03 DIAGNOSIS — Z23 Encounter for immunization: Secondary | ICD-10-CM

## 2019-08-03 DIAGNOSIS — E782 Mixed hyperlipidemia: Secondary | ICD-10-CM | POA: Diagnosis not present

## 2019-08-03 DIAGNOSIS — R7989 Other specified abnormal findings of blood chemistry: Secondary | ICD-10-CM | POA: Diagnosis not present

## 2019-08-03 LAB — POCT GLYCOSYLATED HEMOGLOBIN (HGB A1C)
HbA1c POC (<> result, manual entry): 6.8 % (ref 4.0–5.6)
HbA1c, POC (controlled diabetic range): 6.8 % (ref 0.0–7.0)
HbA1c, POC (prediabetic range): 6.8 % — AB (ref 5.7–6.4)
Hemoglobin A1C: 6.8 % — AB (ref 4.0–5.6)

## 2019-08-03 MED ORDER — METFORMIN HCL 1000 MG PO TABS: ORAL_TABLET | ORAL | 1 refills | Status: DC

## 2019-08-03 MED ORDER — GLIMEPIRIDE 4 MG PO TABS: 8.0000 mg | ORAL_TABLET | Freq: Every day | ORAL | 1 refills | Status: DC

## 2019-08-03 MED ORDER — SITAGLIPTIN PHOSPHATE 100 MG PO TABS: 100.0000 mg | ORAL_TABLET | Freq: Every day | ORAL | 1 refills | Status: DC

## 2019-08-03 MED ORDER — ATORVASTATIN CALCIUM 20 MG PO TABS: 20.0000 mg | ORAL_TABLET | Freq: Every day | ORAL | 3 refills | Status: DC

## 2019-08-03 MED ORDER — FARXIGA 5 MG PO TABS: 5.0000 mg | ORAL_TABLET | Freq: Every day | ORAL | 1 refills | Status: DC

## 2019-08-03 NOTE — Patient Instructions (Addendum)
Great to see you . Your a1c looked great at 6.8!!! F/u 4 mos.

## 2019-08-03 NOTE — Progress Notes (Signed)
Patient ID: Blake Davis, male   DOB: [birth year], 44 y.o.   MRN: [id number]    Blake Davis , 20-Apr-1976, 44 y.o., male MRN: [id number]  Chief Complaint  Patient presents with  . Diabetes    Pt needs refills on his medications    Patient Care Team    Relationship Specialty Notifications Start End  Ma Hillock, DO PCP - General Family Medicine  12/02/15     Subjective:  Diabetes type 2 with retinopathy/HLD Pt reports compliance with metformin 1000 mg BID, Amaryl 4/4 mg BID and  Januvia. Patient denies dizziness, hyperglycemic or hypoglycemic events. Patient denies numbness, tingling in the extremities or nonhealing wounds of feet.  He has very mild chronic neuropathy.  He is prescribed statin and reports compliance PNA series: series now completed. 04/04/2017 Flu shot: Completed today.  (recommneded yearly) BMP: 10/02/2018 WNL Microalb: UTD 04/2018 Foot exam: 09/22/2018 Eye exam:  08/2017 Dr. Dedra Skeens Aziem--> retinopathy--> encouraged to schedule.  A1c: 7.3--> 6.4---> 7.6--> 7.9--> 7.9--> 8.5--> 10.3--> 6.8--> 7.6 >> 8.7> 6.8 today   Thrombocytopenia : new on lab work 01/2018>> platelets 92.  Prior collection was 2017 and platelets were 2003.  Lab Results  Component Value Date   HGBA1C 6.8 (A) 08/03/2019   HGBA1C 6.8 08/03/2019   HGBA1C 6.8 (A) 08/03/2019   HGBA1C 6.8 08/03/2019    Past Medical History:  Diagnosis Date  . Cholestasis 12/2018   per ct for kidney stone  . Diabetes mellitus without complication ([place])   . Hepatic steatosis 12/2018   Extensive hepatic Seatosis per CT completed for kidney stone June 2020  . Nephrolithiasis 12/2018   2 mm nonobstructive stone  . Visual changes 10/2015   Diabetic changes   Past Surgical History:  Procedure Laterality Date  . FOOT SURGERY  1988   mole/nevus  . NASAL SEPTUM SURGERY  1999   deviated   Family History  Problem Relation Age of Onset  . Diabetes Mother   . Heart disease Mother   . Heart attack Mother   .  Alcohol abuse Paternal Uncle   . Dementia Paternal Grandmother   . Diabetes Paternal Grandfather   . Stroke Paternal Grandfather   . Cancer Paternal Grandfather   . Prostate cancer Paternal Grandfather    Allergies as of 08/03/2019   No Known Allergies     Medication List       Accurate as of August 03, 2019  3:36 PM. If you have any questions, ask your nurse or doctor.        STOP taking these medications   ondansetron 4 MG tablet Commonly known as: ZOFRAN Stopped by: Howard Pouch, DO   promethazine 25 MG suppository Commonly known as: Phenergan Stopped by: Howard Pouch, DO     TAKE these medications   atorvastatin 20 MG tablet Commonly known as: LIPITOR Take 1 tablet (20 mg total) by mouth daily.   dapagliflozin propanediol 5 MG Tabs tablet Commonly known as: Farxiga Take 5 mg by mouth daily.   Fish Oil 1000 MG Caps Take by mouth.   glimepiride 4 MG tablet Commonly known as: AMARYL Take 2 tablets (8 mg total) by mouth daily with breakfast.   HYDROcodone-acetaminophen 5-325 MG tablet Commonly known as: NORCO/VICODIN Take 1 tablet by mouth every 6 (six) hours as needed for moderate pain.   metFORMIN 1000 MG tablet Commonly known as: GLUCOPHAGE 1000 mg BID with food   onetouch ultrasoft lancets   OneTouch Verio test strip Generic drug:  glucose blood   sitaGLIPtin 100 MG tablet Commonly known as: JANUVIA Take 1 tablet (100 mg total) by mouth daily.       ROS: Negative, with the exception of above mentioned in HPI  Objective:  BP 139/81 (BP Location: Left Arm, Patient Position: Sitting, Cuff Size: Normal)   Pulse 81   Temp 97.6 F (36.4 C) (Temporal)   Resp 16   Ht 5\' 11"  (1.803 m)   Wt 246 lb 6 oz (111.8 kg)   SpO2 98%   BMI 34.36 kg/m  Body mass index is 34.36 kg/m. Gen: Afebrile. No acute distress.  Nontoxic in appearance, well-developed, well-nourished, obese Caucasian male. HENT: AT. [place].  Eyes:Pupils Equal Round Reactive to light,  Extraocular movements intact,  Conjunctiva without redness, discharge or icterus. Neck/lymp/endocrine: Supple, no lymphadenopathy, no thyromegaly CV: RRR no murmur, no edema, +2/4 P posterior tibialis pulses Chest: CTAB, no wheeze or crackles Skin: No rashes, purpura or petechiae.  Neuro:  Normal gait. PERLA. EOMi. Alert. Oriented.  Psych: Normal affect, dress and demeanor. Normal speech. Normal thought content and judgment..    Results for orders placed or performed in visit on 08/03/19 (from the past 24 hour(s))  POCT HgB A1C     Status: Abnormal   Collection Time: 08/03/19  3:29 PM  Result Value Ref Range   Hemoglobin A1C 6.8 (A) 4.0 - 5.6 %   HbA1c POC (<> result, manual entry) 6.8 4.0 - 5.6 %   HbA1c, POC (prediabetic range) 6.8 (A) 5.7 - 6.4 %   HbA1c, POC (controlled diabetic range) 6.8 0.0 - 7.0 %    Assessment/Plan: Blake Davis is a 44 y.o. male present for OV for  Type 2 diabetes mellitus with complication, without long-term current use of insulin (HCC) Other diabetic neurological complication associated with type 2 diabetes mellitus (HCC) /Morbid obesity (HCC) Hyperlipidemia -Stable. - continue metformin 1000 mg BID.  - continue januvia 100 QD - continue amaryl to 8 mg total daily- may take as BID if desired.  - continue statin  PNA series: series now completed. 04/04/2017 Flu shot: Completed today.  (recommneded yearly) BMP: 10/02/2018 WNL Microalb: Collected today-normal 08/03/2019 Foot exam: 09/22/2018 Eye exam:  08/2017 Dr. 09/2017 Aziem--> retinopathy--> encouraged to schedule.  A1c: 7.3--> 6.4---> 7.6--> 7.9--> 7.9--> 8.5--> 10.3--> 6.8--> 7.6 >> 8.7> 6.8 today   Elevated TSH: Last TSH in June mildly elevated.  Needs repeated today to ensure return to normal.  TSH and T4 collected today.   Orders Placed This Encounter  Procedures  . POCT HgB A1C   Meds ordered this encounter  Medications  . dapagliflozin propanediol (FARXIGA) 5 MG TABS tablet    Sig: Take  5 mg by mouth daily.    Dispense:  90 tablet    Refill:  1  . glimepiride (AMARYL) 4 MG tablet    Sig: Take 2 tablets (8 mg total) by mouth daily with breakfast.    Dispense:  180 tablet    Refill:  1  . metFORMIN (GLUCOPHAGE) 1000 MG tablet    Sig: 1000 mg BID with food    Dispense:  180 tablet    Refill:  1  . sitaGLIPtin (JANUVIA) 100 MG tablet    Sig: Take 1 tablet (100 mg total) by mouth daily.    Dispense:  90 tablet    Refill:  1  . atorvastatin (LIPITOR) 20 MG tablet    Sig: Take 1 tablet (20 mg total) by mouth daily.  Dispense:  90 tablet    Refill:  3     electronically signed by:  Felix Pacini, DO  [facility] - [place]

## 2019-08-04 LAB — MICROALBUMIN / CREATININE URINE RATIO
Creatinine, Urine: 155 mg/dL (ref 20–320)
Microalb Creat Ratio: 3 mcg/mg creat (ref <30)
Microalb, Ur: 0.5 mg/dL

## 2019-08-04 LAB — T4, FREE: Free T4: 1.1 ng/dL (ref 0.8–1.8)

## 2019-08-04 LAB — TSH: TSH: 3.53 mIU/L (ref 0.40–4.50)

## 2019-08-06 ENCOUNTER — Encounter: Payer: Self-pay | Admitting: Family Medicine

## 2019-08-08 ENCOUNTER — Ambulatory Visit: Payer: Managed Care, Other (non HMO) | Admitting: Family Medicine

## 2019-08-22 ENCOUNTER — Other Ambulatory Visit: Payer: Self-pay

## 2019-08-22 ENCOUNTER — Encounter: Payer: Self-pay | Admitting: Family Medicine

## 2019-08-22 ENCOUNTER — Ambulatory Visit: Payer: Managed Care, Other (non HMO) | Admitting: Family Medicine

## 2019-08-22 VITALS — BP 130/79 | HR 89 | Temp 98.0°F | Resp 16 | Ht 71.0 in | Wt 249.0 lb

## 2019-08-22 DIAGNOSIS — M25512 Pain in left shoulder: Secondary | ICD-10-CM | POA: Diagnosis not present

## 2019-08-22 DIAGNOSIS — M25511 Pain in right shoulder: Secondary | ICD-10-CM

## 2019-08-22 DIAGNOSIS — G8929 Other chronic pain: Secondary | ICD-10-CM

## 2019-08-22 MED ORDER — MELOXICAM 15 MG PO TABS: 15.0000 mg | ORAL_TABLET | Freq: Every day | ORAL | 1 refills | Status: DC

## 2019-08-22 NOTE — Progress Notes (Signed)
This visit occurred during the SARS-CoV-2 public health emergency.  Safety protocols were in place, including screening questions prior to the visit, additional usage of staff PPE, and extensive cleaning of exam room while observing appropriate contact time as indicated for disinfecting solutions.    Blake Davis , 14-Oct-1975, 44 y.o., male MRN: [id number] Patient Care Team    Relationship Specialty Notifications Start End  Ma Hillock, DO PCP - General Family Medicine  12/02/15     Chief Complaint  Patient presents with  . Shoulder Pain    Bilateral shoulder pain since May. Wakes him up at night. Full ROM but unable to lift arms above head without pain.      Subjective: Pt presents for an OV with complaints of bilateral shoulder discomfort of months duration.  Associated symptoms include worsening pain and decreased range of motion.  Patient reports he is unable to get comfortable at night secondary to the pain.  He feels that he cannot raise his arms above his head without discomfort.  Reaching behind his back is uncomfortable.  He denies any known injury prior to onset of discomfort.  He denies any known family history of autoimmune disorders or arthritis. He has not tried taking anything for the pain.  Depression screen [facility] 2/9 12/29/2018 05/22/2018 08/04/2017 04/04/2017  Decreased Interest 0 0 0 0  Down, Depressed, Hopeless 0 0 0 0  PHQ - 2 Score 0 0 0 0    No Known Allergies Social History   Social History Narrative   Patient lives at home with wife (Mel). No children.   Has two pets.    Bachelors degree, works as a Associate Professor   Drinks caffeinated beverages   Wears his seatbelt   Smoke detector in home   Feels safe in his relationships   Past Medical History:  Diagnosis Date  . Cholestasis 12/2018   per ct for kidney stone  . Diabetes mellitus without complication ([place])   . Hepatic steatosis 12/2018   Extensive hepatic Seatosis per CT completed for  kidney stone June 2020  . Nephrolithiasis 12/2018   2 mm nonobstructive stone  . Visual changes 10/2015   Diabetic changes   Past Surgical History:  Procedure Laterality Date  . FOOT SURGERY  1988   mole/nevus  . NASAL SEPTUM SURGERY  1999   deviated   Family History  Problem Relation Age of Onset  . Diabetes Mother   . Heart disease Mother   . Heart attack Mother   . Alcohol abuse Paternal Uncle   . Dementia Paternal Grandmother   . Diabetes Paternal Grandfather   . Stroke Paternal Grandfather   . Cancer Paternal Grandfather   . Prostate cancer Paternal Grandfather    Allergies as of 08/22/2019   No Known Allergies     Medication List       Accurate as of August 22, 2019 11:59 PM. If you have any questions, ask your nurse or doctor.        STOP taking these medications   HYDROcodone-acetaminophen 5-325 MG tablet Commonly known as: NORCO/VICODIN Stopped by: Howard Pouch, DO     TAKE these medications   atorvastatin 20 MG tablet Commonly known as: LIPITOR Take 1 tablet (20 mg total) by mouth daily.   Farxiga 5 MG Tabs tablet Generic drug: dapagliflozin propanediol Take 5 mg by mouth daily.   Fish Oil 1000 MG Caps Take by mouth.   glimepiride 4 MG tablet Commonly  known as: AMARYL Take 2 tablets (8 mg total) by mouth daily with breakfast.   meloxicam 15 MG tablet Commonly known as: MOBIC Take 1 tablet (15 mg total) by mouth daily. Started by: Felix Pacini, DO   metFORMIN 1000 MG tablet Commonly known as: GLUCOPHAGE 1000 mg BID with food   onetouch ultrasoft lancets   OneTouch Verio test strip Generic drug: glucose blood   sitaGLIPtin 100 MG tablet Commonly known as: JANUVIA Take 1 tablet (100 mg total) by mouth daily.       All past medical history, surgical history, allergies, family history, immunizations andmedications were updated in the EMR today and reviewed under the history and medication portions of their EMR.     ROS: Negative,  with the exception of above mentioned in HPI   Objective:  BP 130/79 (BP Location: Left Arm, Patient Position: Sitting, Cuff Size: Normal)   Pulse 89   Temp 98 F (36.7 C) (Temporal)   Resp 16   Ht 5\' 11"  (1.803 m)   Wt 249 lb (112.9 kg)   SpO2 97%   BMI 34.73 kg/m  Body mass index is 34.73 kg/m. Gen: Afebrile. No acute distress. Nontoxic in appearance, well developed, well nourished.  MSK: No erythema, no soft tissue swelling.  Mild tenderness to palpation bilateral upper trap and deltoids.  Mild decreased range of motion in abduction with pain on full abduction of right shoulder.  Positive empty can test bilaterally.  Positive Hawkins left.  Positive liftoff bilaterally.  Negative O'Brien's bilaterally.  Neurovascularly intact distally. Skin: No rashes, purpura or petechiae.  Neuro:  Normal gait. PERLA. EOMi. Alert. Oriented x3  No exam data present No results found. No results found for this or any previous visit (from the past 24 hour(s)).  Assessment/Plan: Blake Davis is a 44 y.o. male present for OV for  Chronic pain of both shoulders Discussed options with him today.  This could be rotator cuff, although odd to have bilateral presentation.  Discussed autoimmune disorders and rheumatological disorders today as well.  He is agreeable to start work-up with inflammatory markers and ANA today. -Start Mobic 15 mg daily. - ANA,IFA RA Diag Pnl w/rflx Tit/Patn; Future - C-reactive protein; Future - Sedimentation rate; Future -Patient did desire referral to orthopedics, which was placed for him today.   Reviewed expectations re: course of current medical issues.  Discussed self-management of symptoms.  Outlined signs and symptoms indicating need for more acute intervention.  Patient verbalized understanding and all questions were answered.  Patient received an After-Visit Summary.    Orders Placed This Encounter  Procedures  . ANA,IFA RA Diag Pnl w/rflx Tit/Patn    . C-reactive protein  . Sedimentation rate   Meds ordered this encounter  Medications  . meloxicam (MOBIC) 15 MG tablet    Sig: Take 1 tablet (15 mg total) by mouth daily.    Dispense:  90 tablet    Refill:  1    Note is dictated utilizing voice recognition software. Although note has been proof read prior to signing, occasional typographical errors still can be missed. If any questions arise, please do not hesitate to call for verification.   electronically signed by:  54, DO  [facility] - [place]

## 2019-08-22 NOTE — Patient Instructions (Signed)
We will call you with lab results once available.  Start meloxicam once daily (can be before bed)  We will refer to ortho for eval.    This can be related to shoulders, autoimmune disorder,arthritis etc. The labs today will help decipher cause.

## 2019-08-24 ENCOUNTER — Other Ambulatory Visit: Payer: Self-pay

## 2019-08-24 ENCOUNTER — Ambulatory Visit (INDEPENDENT_AMBULATORY_CARE_PROVIDER_SITE_OTHER): Payer: Managed Care, Other (non HMO)

## 2019-08-24 DIAGNOSIS — M25511 Pain in right shoulder: Secondary | ICD-10-CM | POA: Diagnosis not present

## 2019-08-24 DIAGNOSIS — M25512 Pain in left shoulder: Secondary | ICD-10-CM | POA: Diagnosis not present

## 2019-08-24 DIAGNOSIS — G8929 Other chronic pain: Secondary | ICD-10-CM | POA: Diagnosis not present

## 2019-08-27 ENCOUNTER — Encounter: Payer: Self-pay | Admitting: Family Medicine

## 2019-08-27 LAB — C-REACTIVE PROTEIN: CRP: 2.5 mg/L (ref <8.0)

## 2019-08-27 LAB — ANA,IFA RA DIAG PNL W/RFLX TIT/PATN
Anti Nuclear Antibody (ANA): NEGATIVE
Cyclic Citrullin Peptide Ab: <16 UNITS
Rheumatoid fact SerPl-aCnc: <14 IU/mL (ref <14)

## 2019-08-27 LAB — SEDIMENTATION RATE: Sed Rate: 9 mm/h (ref 0–15)

## 2019-09-10 DIAGNOSIS — M25512 Pain in left shoulder: Secondary | ICD-10-CM

## 2019-09-10 DIAGNOSIS — M25511 Pain in right shoulder: Secondary | ICD-10-CM | POA: Insufficient documentation

## 2019-09-10 HISTORY — DX: Pain in left shoulder: M25.512

## 2019-09-10 HISTORY — DX: Pain in right shoulder: M25.511

## 2019-09-25 LAB — HM DIABETES EYE EXAM

## 2019-11-28 ENCOUNTER — Encounter: Payer: Self-pay | Admitting: Family Medicine

## 2019-11-28 ENCOUNTER — Other Ambulatory Visit: Payer: Self-pay

## 2019-11-28 ENCOUNTER — Ambulatory Visit: Payer: Managed Care, Other (non HMO) | Admitting: Family Medicine

## 2019-11-28 VITALS — BP 130/86 | HR 95 | Temp 97.5°F | Resp 18 | Ht 71.0 in | Wt 246.1 lb

## 2019-11-28 DIAGNOSIS — E782 Mixed hyperlipidemia: Secondary | ICD-10-CM

## 2019-11-28 DIAGNOSIS — G8929 Other chronic pain: Secondary | ICD-10-CM

## 2019-11-28 DIAGNOSIS — E1149 Type 2 diabetes mellitus with other diabetic neurological complication: Secondary | ICD-10-CM

## 2019-11-28 DIAGNOSIS — E118 Type 2 diabetes mellitus with unspecified complications: Secondary | ICD-10-CM

## 2019-11-28 DIAGNOSIS — M25511 Pain in right shoulder: Secondary | ICD-10-CM

## 2019-11-28 DIAGNOSIS — M25512 Pain in left shoulder: Secondary | ICD-10-CM

## 2019-11-28 DIAGNOSIS — L989 Disorder of the skin and subcutaneous tissue, unspecified: Secondary | ICD-10-CM

## 2019-11-28 LAB — POCT GLYCOSYLATED HEMOGLOBIN (HGB A1C)
HbA1c POC (<> result, manual entry): 7.1 % (ref 4.0–5.6)
HbA1c, POC (controlled diabetic range): 7.1 % — AB (ref 0.0–7.0)
HbA1c, POC (prediabetic range): 7.1 % — AB (ref 5.7–6.4)
Hemoglobin A1C: 7.1 % — AB (ref 4.0–5.6)

## 2019-11-28 MED ORDER — GLIMEPIRIDE 4 MG PO TABS: 8.0000 mg | ORAL_TABLET | Freq: Every day | ORAL | 1 refills | Status: DC

## 2019-11-28 MED ORDER — METFORMIN HCL 1000 MG PO TABS: ORAL_TABLET | ORAL | 1 refills | Status: DC

## 2019-11-28 MED ORDER — FARXIGA 5 MG PO TABS: 5.0000 mg | ORAL_TABLET | Freq: Every day | ORAL | 1 refills | Status: DC

## 2019-11-28 MED ORDER — MELOXICAM 15 MG PO TABS: 15.0000 mg | ORAL_TABLET | Freq: Every day | ORAL | 1 refills | Status: DC

## 2019-11-28 MED ORDER — SITAGLIPTIN PHOSPHATE 100 MG PO TABS: 100.0000 mg | ORAL_TABLET | Freq: Every day | ORAL | 1 refills | Status: DC

## 2019-11-28 NOTE — Patient Instructions (Signed)
Great to see you.  meds the same and refilled.  I have referred you to dermatology for your thumb.   Follow up 3 mos.

## 2019-11-28 NOTE — Progress Notes (Signed)
Patient ID: Blake Davis, male   DOB: 1975-08-23, 44 y.o.   MRN: [id number]    Blake Davis , 1976-07-14, 44 y.o., male MRN: [id number]  Chief Complaint  Patient presents with  . Hyperlipidemia    Fasting. Needs refills.   . Diabetes   Patient Care Team    Relationship Specialty Notifications Start End  Natalia Leatherwood, DO PCP - General Family Medicine  12/02/15     Subjective: Blake Davis is a 44 y.o. male present for [facility] Diabetes type 2 with retinopathy/HLD Pt reports complaince with metformin 1000 mg BID, Amaryl 4/4 mg BID, farxiga 5 and  Januvia. Patient denies dizziness, hyperglycemic or hypoglycemic events. Patient denies numbness, tingling in the extremities or nonhealing wounds of feet.  He has very mild chronic neuropathy.  He is prescribed statin and reports compliance PNA series: series now completed. 04/04/2017 Flu shot: Completed today.  (recommneded yearly) BMP: 12/2018 WNL Microalb: normal 08/03/2019 Foot exam: 11/28/2019 Eye exam:  09/25/2019 Dr. Lewis Moccasin A1c: 7.3--> 6.4---> 7.6--> 7.9--> 7.9--> 8.5--> 10.3--> 6.8--> 7.6 >> 8.7> 6.8>7.1 today  Shoulder pain: improved with mobic. Has been seen by ortho and performed PT.   Lab Results  Component Value Date   HGBA1C 7.1 (A) 11/28/2019   HGBA1C 7.1 11/28/2019   HGBA1C 7.1 (A) 11/28/2019   HGBA1C 7.1 (A) 11/28/2019    Past Medical History:  Diagnosis Date  . Cholestasis 12/2018   per ct for kidney stone  . Diabetes mellitus without complication (HCC)   . Hepatic steatosis 12/2018   Extensive hepatic Seatosis per CT completed for kidney stone June 2020  . Nephrolithiasis 12/2018   2 mm nonobstructive stone  . Visual changes 10/2015   Diabetic changes   Past Surgical History:  Procedure Laterality Date  . FOOT SURGERY  1988   mole/nevus  . NASAL SEPTUM SURGERY  1999   deviated   Family History  Problem Relation Age of Onset  . Diabetes Mother   . Heart disease Mother   . Heart attack Mother     . Alcohol abuse Paternal Uncle   . Dementia Paternal Grandmother   . Diabetes Paternal Grandfather   . Stroke Paternal Grandfather   . Cancer Paternal Grandfather   . Prostate cancer Paternal Grandfather    Allergies as of 11/28/2019   No Known Allergies     Medication List       Accurate as of Nov 28, 2019 10:44 AM. If you have any questions, ask your nurse or doctor.        atorvastatin 20 MG tablet Commonly known as: LIPITOR Take 1 tablet (20 mg total) by mouth daily.   Farxiga 5 MG Tabs tablet Generic drug: dapagliflozin propanediol Take 5 mg by mouth daily.   Fish Oil 1000 MG Caps Take by mouth.   glimepiride 4 MG tablet Commonly known as: AMARYL Take 2 tablets (8 mg total) by mouth daily with breakfast.   HAIR/SKIN/NAILS PO Take by mouth.   meloxicam 15 MG tablet Commonly known as: MOBIC Take 1 tablet (15 mg total) by mouth daily.   metFORMIN 1000 MG tablet Commonly known as: GLUCOPHAGE 1000 mg BID with food   MULTI COMPLETE PO Take by mouth.   onetouch ultrasoft lancets   OneTouch Verio test strip Generic drug: glucose blood   sitaGLIPtin 100 MG tablet Commonly known as: JANUVIA Take 1 tablet (100 mg total) by mouth daily.       ROS: Negative, with the  exception of above mentioned in HPI  Objective:  BP 130/86 (BP Location: Left Arm, Patient Position: Sitting, Cuff Size: Normal)   Pulse 95   Temp (!) 97.5 F (36.4 C) (Temporal)   Resp 18   Ht 5\' 11"  (1.803 m)   Wt 246 lb 2 oz (111.6 kg)   SpO2 96%   BMI 34.33 kg/m  Body mass index is 34.33 kg/m. Gen: Afebrile. No acute distress. Nontoxic. Male.  HENT: AT. [place].  Eyes:Pupils Equal Round Reactive to light, Extraocular movements intact,  Conjunctiva without redness, discharge or icterus. CV: RRR no murmur, no edema, +2/4 P posterior tibialis pulses Chest: CTAB, no wheeze or crackles Abd: Soft. NTND. BS present.  rom improved.  Skin: right thumb scaly distal tip, no other  rashes, no purpura or petechiae.  Neuro: Normal gait. PERLA. EOMi. Alert. Oriented.  Psych: Normal affect, dress and demeanor. Normal speech. Normal thought content and judgment.    Results for orders placed or performed in visit on 11/28/19 (from the past 24 hour(s))  POCT glycosylated hemoglobin (Hb A1C)     Status: Abnormal   Collection Time: 11/28/19  8:26 AM  Result Value Ref Range   Hemoglobin A1C 7.1 (A) 4.0 - 5.6 %   HbA1c POC (<> result, manual entry) 7.1 4.0 - 5.6 %   HbA1c, POC (prediabetic range) 7.1 (A) 5.7 - 6.4 %   HbA1c, POC (controlled diabetic range) 7.1 (A) 0.0 - 7.0 %    Assessment/Plan: Blake Davis is a 44 y.o. male present for OV for  Type 2 diabetes mellitus with complication, without long-term current use of insulin (HCC) Other diabetic neurological complication associated with type 2 diabetes mellitus (HCC) /Morbid obesity (HCC) Hyperlipidemia - stable> mild increase  - continue metformin 1000 mg BID.  - ccontinue januvia 100 QD - continue amaryl to 8 mg total daily- may take as BID if desired.  - continue farxiga 5 mg QD - continue statin  PNA series: series now completed. 04/04/2017 Flu shot: Completed today.  (recommneded yearly) BMP: 12/2018 WNL Microalb: normal 08/03/2019 Foot exam: 11/28/2019 Eye exam:  09/25/2019 Dr. 11/25/2019 A1c: 7.3--> 6.4---> 7.6--> 7.9--> 7.9--> 8.5--> 10.3--> 6.8--> 7.6 >> 8.7> 6.8>7.1 today   Shoulder pain:  - improved. Continue mobic.  Continue PT exercises at home.   Skin lesion: - tried antifungal a few yrs ago- not helpful. He reports he did not schedule with derm when referred last time.  - referral placed again today per pt request  F/u 3 mos. - labs due next visit also  Orders Placed This Encounter  Procedures  . Ambulatory referral to Dermatology  . POCT glycosylated hemoglobin (Hb A1C)   Meds ordered this encounter  Medications  . metFORMIN (GLUCOPHAGE) 1000 MG tablet    Sig: 1000 mg BID with food     Dispense:  180 tablet    Refill:  1  . meloxicam (MOBIC) 15 MG tablet    Sig: Take 1 tablet (15 mg total) by mouth daily.    Dispense:  90 tablet    Refill:  1  . sitaGLIPtin (JANUVIA) 100 MG tablet    Sig: Take 1 tablet (100 mg total) by mouth daily.    Dispense:  90 tablet    Refill:  1  . glimepiride (AMARYL) 4 MG tablet    Sig: Take 2 tablets (8 mg total) by mouth daily with breakfast.    Dispense:  180 tablet    Refill:  1  .  dapagliflozin propanediol (FARXIGA) 5 MG TABS tablet    Sig: Take 5 mg by mouth daily.    Dispense:  90 tablet    Refill:  1     Referral Orders     Ambulatory referral to Dermatology  electronically signed by:  Howard Pouch, DO  [place]

## 2019-12-06 ENCOUNTER — Emergency Department (HOSPITAL_COMMUNITY)
Admission: EM | Admit: 2019-12-06 | Discharge: 2019-12-06 | Disposition: A | Discharge status: Home / Self Care | Payer: Managed Care, Other (non HMO) | Attending: Emergency Medicine | Admitting: Emergency Medicine

## 2019-12-06 ENCOUNTER — Encounter (HOSPITAL_COMMUNITY): Payer: Self-pay | Admitting: Emergency Medicine

## 2019-12-06 ENCOUNTER — Other Ambulatory Visit: Payer: Self-pay

## 2019-12-06 DIAGNOSIS — M79674 Pain in right toe(s): Secondary | ICD-10-CM | POA: Diagnosis present

## 2019-12-06 DIAGNOSIS — L600 Ingrowing nail: Secondary | ICD-10-CM | POA: Diagnosis not present

## 2019-12-06 DIAGNOSIS — E119 Type 2 diabetes mellitus without complications: Secondary | ICD-10-CM | POA: Diagnosis not present

## 2019-12-06 DIAGNOSIS — Z79899 Other long term (current) drug therapy: Secondary | ICD-10-CM | POA: Diagnosis not present

## 2019-12-06 DIAGNOSIS — Z7984 Long term (current) use of oral hypoglycemic drugs: Secondary | ICD-10-CM | POA: Insufficient documentation

## 2019-12-06 LAB — CBG MONITORING, ED: Glucose-Capillary: 167 mg/dL — ABNORMAL HIGH (ref 70–99)

## 2019-12-06 MED ORDER — LIDOCAINE HCL (PF) 1 % IJ SOLN
30.0000 mL | Freq: Once | INTRAMUSCULAR | Status: AC
  Administered 2019-12-06: 30 mL
  Filled 2019-12-06: qty 30

## 2019-12-06 MED ORDER — HIBICLENS 4 % EX LIQD: CUTANEOUS | 0 refills | Status: DC

## 2019-12-06 MED ORDER — DOXYCYCLINE HYCLATE 100 MG PO TABS
100.0000 mg | ORAL_TABLET | Freq: Once | ORAL | Status: AC
  Administered 2019-12-06: 100 mg via ORAL
  Filled 2019-12-06: qty 1

## 2019-12-06 MED ORDER — DOXYCYCLINE HYCLATE 100 MG PO CAPS: 100.0000 mg | ORAL_CAPSULE | Freq: Two times a day (BID) | ORAL | 0 refills | Status: DC

## 2019-12-06 NOTE — ED Provider Notes (Signed)
[facility] EMERGENCY DEPARTMENT Provider Note   CSN: [id number] Arrival date & time: 12/06/19  2047     History Chief Complaint  Patient presents with  . Great Toe Infection    Blake Davis is a 44 y.o. male.  Patient presents to the emergency department with a chief complaint of right great toe infection.  He states that he noticed redness around the right great toe this morning.  He has a type II diabetic, and wanted to be evaluated due to concern for infection.  He denies any fevers or chills.  He denies any significant pain.  He does report that he has peripheral neuropathy.  He denies any known trauma.  He states that he has had ingrown toenails in the past.  Denies any medication allergies.  Denies any other associated symptoms.  The history is provided by the patient. No language interpreter was used.       Past Medical History:  Diagnosis Date  . Cholestasis 12/2018   per ct for kidney stone  . Diabetes mellitus without complication (HCC)   . Hepatic steatosis 12/2018   Extensive hepatic Seatosis per CT completed for kidney stone June 2020  . Nephrolithiasis 12/2018   2 mm nonobstructive stone  . Visual changes 10/2015   Diabetic changes    Patient Active Problem List   Diagnosis Date Noted  . Injury of right foot 09/22/2018  . Mixed hyperlipidemia 05/22/2018  . Morbid obesity (HCC) 04/04/2017  . Type 2 diabetes mellitus with complication, without long-term current use of insulin (HCC) 09/06/2016  . Diabetic neuropathy (HCC) 06/07/2016  . Erectile dysfunction 12/02/2015    Past Surgical History:  Procedure Laterality Date  . FOOT SURGERY  1988   mole/nevus  . NASAL SEPTUM SURGERY  1999   deviated       Family History  Problem Relation Age of Onset  . Diabetes Mother   . Heart disease Mother   . Heart attack Mother   . Alcohol abuse Paternal Uncle   . Dementia Paternal Grandmother   . Diabetes Paternal Grandfather   .  Stroke Paternal Grandfather   . Cancer Paternal Grandfather   . Prostate cancer Paternal Grandfather     Social History   Tobacco Use  . Smoking status: Never Smoker  . Smokeless tobacco: Never Used  Substance Use Topics  . Alcohol use: No    Alcohol/week: 0.0 standard drinks  . Drug use: No    Home Medications Prior to Admission medications   Medication Sig Start Date End Date Taking? Authorizing Provider  atorvastatin (LIPITOR) 20 MG tablet Take 1 tablet (20 mg total) by mouth daily. 08/03/19   Kuneff, Renee A, DO  Biotin w/ Vitamins C & E (HAIR/SKIN/NAILS PO) Take by mouth.    [provider]  dapagliflozin propanediol (FARXIGA) 5 MG TABS tablet Take 5 mg by mouth daily. 11/28/19   Kuneff, Renee A, DO  glimepiride (AMARYL) 4 MG tablet Take 2 tablets (8 mg total) by mouth daily with breakfast. 11/28/19   Claiborne Billings, Renee A, DO  Lancets (ONETOUCH ULTRASOFT) lancets  12/04/15   [provider]  meloxicam (MOBIC) 15 MG tablet Take 1 tablet (15 mg total) by mouth daily. 11/28/19   Kuneff, Renee A, DO  metFORMIN (GLUCOPHAGE) 1000 MG tablet 1000 mg BID with food 11/28/19   Kuneff, Renee A, DO  Multiple Vitamins-Minerals (MULTI COMPLETE PO) Take by mouth.    [provider]  Omega-3 Fatty Acids (FISH  OIL) 1000 MG CAPS Take by mouth.    [provider]  [facility] VERIO test strip  12/04/15   [provider]  sitaGLIPtin (JANUVIA) 100 MG tablet Take 1 tablet (100 mg total) by mouth daily. 11/28/19   Kuneff, Renee A, DO    Allergies    Patient has no known allergies.  Review of Systems   Review of Systems  Constitutional: Negative for chills and fever.  Skin: Positive for color change and wound.  Neurological: Negative for weakness and numbness.    Physical Exam Updated Vital Signs BP 130/82 (BP Location: Left Arm)   Pulse 88   Temp 98.9 F (37.2 C) (Oral)   Resp 14   Ht 5\' 11"  (1.803 m)   Wt 120 kg   SpO2 99%   BMI 36.90 kg/m   Physical  Exam Vitals and nursing note reviewed.  Constitutional:      General: He is not in acute distress.    Appearance: He is well-developed. He is not ill-appearing.  HENT:     Head: Normocephalic and atraumatic.  Eyes:     Conjunctiva/sclera: Conjunctivae normal.  Cardiovascular:     Rate and Rhythm: Normal rate.  Pulmonary:     Effort: Pulmonary effort is normal. No respiratory distress.  Abdominal:     General: There is no distension.  Musculoskeletal:     Cervical back: Neck supple.     Comments: Moves all extremities  Skin:    General: Skin is warm and dry.     Comments: Right great toe consistent with cellulitis and developing paronychia secondary to ingrown toenail along the medial aspect  Neurological:     Mental Status: He is alert and oriented to person, place, and time.  Psychiatric:        Mood and Affect: Mood normal.        Behavior: Behavior normal.     ED Results / Procedures / Treatments   Labs (all labs ordered are listed, but only abnormal results are displayed) Labs Reviewed  CBG MONITORING, ED - Abnormal; Notable for the following components:      Result Value   Glucose-Capillary 167 (*)    All other components within normal limits    EKG None  Radiology No results found.  Procedures .Nail Removal  Date/Time: 12/06/2019 10:59 PM Performed by: 12/08/2019, PA-C Authorized by: Roxy Horseman, PA-C   Consent:    Consent obtained:  Verbal   Consent given by:  Patient   Risks discussed:  Pain and infection Location:    Foot:  R big toe Pre-procedure details:    Skin preparation:  Betadine Anesthesia (see MAR for exact dosages):    Anesthesia method:  Nerve block   Block location:  Digital block, right great toe   Block needle gauge:  27 G   Block anesthetic:  Lidocaine 1% w/o epi   Block injection procedure:  Anatomic landmarks identified, introduced needle, incremental injection, anatomic landmarks palpated and negative aspiration for  blood   Block outcome:  Anesthesia achieved Nail Removal:    Nail removed:  Partial   Nail side:  Medial Ingrown nail:    Wedge excision of skin: yes     Nail matrix removed or ablated:  None Post-procedure details:    Dressing:  4x4 sterile gauze, antibiotic ointment, post-op shoe and gauze roll   Patient tolerance of procedure:  Tolerated well, no immediate complications   (including critical care time)  Medications Ordered in ED  Medications  lidocaine (PF) (XYLOCAINE) 1 % injection 30 mL (has no administration in time range)    ED Course  I have reviewed the triage vital signs and the nursing notes.  Pertinent labs & imaging results that were available during my care of the patient were reviewed by me and considered in my medical decision making (see chart for details).    MDM Rules/Calculators/A&P                       Patient signs and symptoms consistent with ingrown toenail of the right great toe with mild paronychia surrounding cellulitis.  Will need removal of the medial portion of the toenail.  We will also plan to cover patient with antibiotic therapy.  Final Clinical Impression(s) / ED Diagnoses Final diagnoses:  Ingrown toenail of right foot    Rx / DC Orders ED Discharge Orders    None       Farmer, Mccahill, PA-C 12/06/19 2300    Carmin Muskrat, MD 12/06/19 2349

## 2019-12-06 NOTE — ED Triage Notes (Signed)
Patient reports right great toe infection with redness/swelling and drainage onset this morning , denies injury/ambulatory .

## 2019-12-06 NOTE — Progress Notes (Signed)
Orthopedic Tech Progress Note Patient Details:  IZZAC ROCKETT 1975/11/03 [id number]  Ortho Devices Type of Ortho Device: Postop shoe/boot Ortho Device/Splint Location: RLE Ortho Device/Splint Interventions: Application   Post Interventions Patient Tolerated: Well Instructions Provided: Adjustment of device, Poper ambulation with device   Yulanda Diggs E Savio Albrecht 12/06/2019, 11:12 PM

## 2019-12-07 ENCOUNTER — Encounter: Payer: Self-pay | Admitting: Family Medicine

## 2020-02-20 ENCOUNTER — Other Ambulatory Visit: Payer: Self-pay

## 2020-02-20 ENCOUNTER — Ambulatory Visit: Payer: Managed Care, Other (non HMO) | Admitting: Physician Assistant

## 2020-02-20 ENCOUNTER — Encounter: Payer: Self-pay | Admitting: Physician Assistant

## 2020-02-20 DIAGNOSIS — L309 Dermatitis, unspecified: Secondary | ICD-10-CM

## 2020-02-20 MED ORDER — PIMECROLIMUS 1 % EX CREA: TOPICAL_CREAM | Freq: Two times a day (BID) | CUTANEOUS | 2 refills | Status: AC

## 2020-02-20 MED ORDER — CLOBETASOL PROP EMOLLIENT BASE 0.05 % EX CREA: 1.0000 "application " | TOPICAL_CREAM | Freq: Two times a day (BID) | CUTANEOUS | 0 refills | Status: DC

## 2020-02-20 NOTE — Progress Notes (Signed)
   New Patient Visit  Subjective  Blake Davis is a 44 y.o. male who presents for the following: Skin Problem (scaling of right thumb for about a decade, has tried various creams and lotions with know change, denies injury). Scaling and peeling of right thumb. He has been given a cream in the past but it never helped. The skin is very tender and peels cracks and bleeds. He often has to keep it covered with a bandaid  Objective  Well appearing patient in no apparent distress; mood and affect are within normal limits.  A focused examination was performed including hands. Relevant physical exam findings are noted in the Assessment and Plan.  Objective  Right Thumb Tip: Scaling patch palmar aspect of right thumb. 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 He is to alternate the topical steroid and the nonsteroidal. He is to use ceramide based cleansers and moisturizers. He is to wear a golf glove or a batting glove for the right hand.  pimecrolimus (ELIDEL) 1 % cream - Right Thumb Tip  Clobetasol Prop Emollient Base 0.05 % emollient cream - Right Thumb Tip

## 2020-02-20 NOTE — Patient Instructions (Signed)
Clobetasol steroid twice a day sat and sun  Pimecrolimus-elidel non steroidal twice a day mon-fri  mosturizer on top of medicine  Golf or batters glove

## 2020-03-11 ENCOUNTER — Ambulatory Visit (INDEPENDENT_AMBULATORY_CARE_PROVIDER_SITE_OTHER): Payer: Managed Care, Other (non HMO) | Admitting: Family Medicine

## 2020-03-11 ENCOUNTER — Other Ambulatory Visit: Payer: Self-pay

## 2020-03-11 ENCOUNTER — Encounter: Payer: Self-pay | Admitting: Family Medicine

## 2020-03-11 VITALS — BP 125/77 | HR 77 | Temp 98.7°F | Resp 16 | Wt 248.4 lb

## 2020-03-11 DIAGNOSIS — E118 Type 2 diabetes mellitus with unspecified complications: Secondary | ICD-10-CM | POA: Diagnosis not present

## 2020-03-11 DIAGNOSIS — M25511 Pain in right shoulder: Secondary | ICD-10-CM

## 2020-03-11 DIAGNOSIS — E782 Mixed hyperlipidemia: Secondary | ICD-10-CM | POA: Diagnosis not present

## 2020-03-11 DIAGNOSIS — G8929 Other chronic pain: Secondary | ICD-10-CM

## 2020-03-11 DIAGNOSIS — M25512 Pain in left shoulder: Secondary | ICD-10-CM

## 2020-03-11 DIAGNOSIS — L600 Ingrowing nail: Secondary | ICD-10-CM

## 2020-03-11 LAB — CBC
HCT: 47.2 % (ref 39.0–52.0)
Hemoglobin: 15.7 g/dL (ref 13.0–17.0)
MCHC: 33.3 g/dL (ref 30.0–36.0)
MCV: 88.3 fl (ref 78.0–100.0)
Platelets: 203 10*3/uL (ref 150.0–400.0)
RBC: 5.35 Mil/uL (ref 4.22–5.81)
RDW: 13.1 % (ref 11.5–15.5)
WBC: 7.8 10*3/uL (ref 4.0–10.5)

## 2020-03-11 LAB — T4, FREE: Free T4: 0.98 ng/dL (ref 0.60–1.60)

## 2020-03-11 LAB — COMPREHENSIVE METABOLIC PANEL
ALT: 37 U/L (ref 0–53)
AST: 21 U/L (ref 0–37)
Albumin: 4.6 g/dL (ref 3.5–5.2)
Alkaline Phosphatase: 40 U/L (ref 39–117)
BUN: 20 mg/dL (ref 6–23)
CO2: 27 mEq/L (ref 19–32)
Calcium: 10.1 mg/dL (ref 8.4–10.5)
Chloride: 103 mEq/L (ref 96–112)
Creatinine, Ser: 1.1 mg/dL (ref 0.40–1.50)
GFR: 72.06 mL/min (ref >60.00)
Glucose, Bld: 164 mg/dL — ABNORMAL HIGH (ref 70–99)
Potassium: 4.6 mEq/L (ref 3.5–5.1)
Sodium: 138 mEq/L (ref 135–145)
Total Bilirubin: 0.5 mg/dL (ref 0.2–1.2)
Total Protein: 7.2 g/dL (ref 6.0–8.3)

## 2020-03-11 LAB — LDL CHOLESTEROL, DIRECT: Direct LDL: 76 mg/dL

## 2020-03-11 LAB — LIPID PANEL
Cholesterol: 127 mg/dL (ref 0–200)
HDL: 32.6 mg/dL — ABNORMAL LOW (ref >39.00)
NonHDL: 94.3
Total CHOL/HDL Ratio: 4
Triglycerides: 201 mg/dL — ABNORMAL HIGH (ref 0.0–149.0)
VLDL: 40.2 mg/dL — ABNORMAL HIGH (ref 0.0–40.0)

## 2020-03-11 LAB — TSH: TSH: 5.9 u[IU]/mL — ABNORMAL HIGH (ref 0.35–4.50)

## 2020-03-11 LAB — POCT GLYCOSYLATED HEMOGLOBIN (HGB A1C)
HbA1c POC (<> result, manual entry): 7.1 % (ref 4.0–5.6)
HbA1c, POC (controlled diabetic range): 7.1 % — AB (ref 0.0–7.0)
HbA1c, POC (prediabetic range): 7.1 % — AB (ref 5.7–6.4)
Hemoglobin A1C: 7.1 % — AB (ref 4.0–5.6)

## 2020-03-11 MED ORDER — METFORMIN HCL 1000 MG PO TABS: ORAL_TABLET | ORAL | 1 refills | Status: DC

## 2020-03-11 MED ORDER — MELOXICAM 15 MG PO TABS: 15.0000 mg | ORAL_TABLET | Freq: Every day | ORAL | 1 refills | Status: DC

## 2020-03-11 MED ORDER — GLIMEPIRIDE 4 MG PO TABS: 8.0000 mg | ORAL_TABLET | Freq: Every day | ORAL | 1 refills | Status: DC

## 2020-03-11 MED ORDER — DAPAGLIFLOZIN PROPANEDIOL 10 MG PO TABS: 10.0000 mg | ORAL_TABLET | Freq: Every day | ORAL | 1 refills | Status: DC

## 2020-03-11 MED ORDER — SITAGLIPTIN PHOSPHATE 100 MG PO TABS: 100.0000 mg | ORAL_TABLET | Freq: Every day | ORAL | 1 refills | Status: DC

## 2020-03-11 NOTE — Progress Notes (Signed)
Patient ID: Blake Davis, male   DOB: [birth year], 44 y.o.   MRN: [id number]    Blake Davis , 04-25-1976, 44 y.o., male MRN: [id number]  Chief Complaint  Patient presents with  . Follow-up    [facility]   Patient Care Team    Relationship Specialty Notifications Start End  Ma Hillock, DO PCP - General Family Medicine  12/02/15   Arlyss Gandy, PA-C  Dermatology  02/20/20     Subjective: Blake Davis is a 44 y.o. male present for [facility] Diabetes type 2 with retinopathy/HLD Pt reports compliance  with metformin 1000 mg BID, Amaryl 4/4 mg BID, farxiga 5 and  Januvia. Patient denies dizziness, hyperglycemic or hypoglycemic events. Patient denies numbness, tingling in the extremities or nonhealing wounds of feet.  BG not routinely checked.  He has very mild chronic neuropathy.  He is prescribed statin and reports compliance  Shoulder pain: Has been seen by ortho and performed PT. repors much improved ROM and decreased pain with mobic qd  Lab Results  Component Value Date   HGBA1C 7.1 (A) 03/11/2020   HGBA1C 7.1 03/11/2020   HGBA1C 7.1 (A) 03/11/2020   HGBA1C 7.1 (A) 03/11/2020    Past Medical History:  Diagnosis Date  . Cholestasis 12/2018   per ct for kidney stone  . Diabetes mellitus without complication ([place])   . Hepatic steatosis 12/2018   Extensive hepatic Seatosis per CT completed for kidney stone June 2020  . Nephrolithiasis 12/2018   2 mm nonobstructive stone  . Visual changes 10/2015   Diabetic changes   Past Surgical History:  Procedure Laterality Date  . FOOT SURGERY  1988   mole/nevus  . NASAL SEPTUM SURGERY  1999   deviated   Family History  Problem Relation Age of Onset  . Diabetes Mother   . Heart disease Mother   . Heart attack Mother   . Alcohol abuse Paternal Uncle   . Dementia Paternal Grandmother   . Diabetes Paternal Grandfather   . Stroke Paternal Grandfather   . Cancer Paternal Grandfather   . Prostate cancer Paternal  Grandfather    Allergies as of 03/11/2020   No Known Allergies     Medication List       Accurate as of March 11, 2020  8:56 AM. If you have any questions, ask your nurse or doctor.        STOP taking these medications   Clobetasol Prop Emollient Base 0.05 % emollient cream Stopped by: Howard Pouch, DO   doxycycline 100 MG capsule Commonly known as: VIBRAMYCIN Stopped by: Howard Pouch, DO   HAIR/SKIN/NAILS PO Stopped by: Howard Pouch, DO   Hibiclens 4 % external liquid Generic drug: chlorhexidine Stopped by: Howard Pouch, DO     TAKE these medications   atorvastatin 20 MG tablet Commonly known as: LIPITOR Take 1 tablet (20 mg total) by mouth daily.   dapagliflozin propanediol 10 MG Tabs tablet Commonly known as: Farxiga Take 1 tablet (10 mg total) by mouth daily. What changed:   medication strength  how much to take Changed by: Howard Pouch, DO   Fish Oil 1000 MG Caps Take by mouth.   glimepiride 4 MG tablet Commonly known as: AMARYL Take 2 tablets (8 mg total) by mouth daily with breakfast.   meloxicam 15 MG tablet Commonly known as: MOBIC Take 1 tablet (15 mg total) by mouth daily.   metFORMIN 1000 MG tablet Commonly known as: GLUCOPHAGE 1000 mg BID with food  MULTI COMPLETE PO Take by mouth.   onetouch ultrasoft lancets   OneTouch Verio test strip Generic drug: glucose blood   pimecrolimus 1 % cream Commonly known as: Elidel Apply topically 2 (two) times daily.   sitaGLIPtin 100 MG tablet Commonly known as: JANUVIA Take 1 tablet (100 mg total) by mouth daily.       ROS: Negative, with the exception of above mentioned in HPI  Objective:  BP 125/77 (BP Location: Right Arm, Patient Position: Sitting, Cuff Size: Large)   Pulse 77   Temp 98.7 F (37.1 C) (Oral)   Resp 16   Wt 248 lb 6.4 oz (112.7 kg)   SpO2 97%   BMI 34.64 kg/m  Body mass index is 34.64 kg/m. Gen: Afebrile. No acute distress. Nontoxix, pleasant male. Obese.    HENT: AT. . Eyes:Pupils Equal Round Reactive to light, Extraocular movements intact,  Conjunctiva without redness, discharge or icterus. CV: RRR  +2/4 P posterior tibialis pulses Chest: CTAB, no wheeze or crackles Skin: no rashes, purpura or petechiae.  Neuro:  Normal gait. PERLA. EOMi. Alert. Oriented x3  Psych: Normal affect, dress and demeanor. Normal speech. Normal thought content and judgment. Foot: right medial large toe with chronic inflammation, thickened nail and ingrown toe nail. No signs of infection today. Thickened elongated nails bilateral feet.    Results for orders placed or performed in visit on 03/11/20 (from the past 24 hour(s))  POCT glycosylated hemoglobin (Hb A1C)     Status: Abnormal   Collection Time: 03/11/20  8:23 AM  Result Value Ref Range   Hemoglobin A1C 7.1 (A) 4.0 - 5.6 %   HbA1c POC (<> result, manual entry) 7.1 4.0 - 5.6 %   HbA1c, POC (prediabetic range) 7.1 (A) 5.7 - 6.4 %   HbA1c, POC (controlled diabetic range) 7.1 (A) 0.0 - 7.0 %    Assessment/Plan: Blake Davis is a 44 y.o. male present for OV for  Type 2 diabetes mellitus with complication, without long-term current use of insulin ([place]) Other diabetic neurological complication associated with type 2 diabetes mellitus ([place]) - stable.  - continue metformin 1000 mg BID.  - continue  januvia 100 QD - continue  amaryl to 8 mg total daily- may take as BID if desired.  - increase farxiga 5 mg> 10 mg  QD - continue  statin  - neuropathy stable> pt has declined gabapentin  PNA series:  completed. 04/04/2017 Flu shot: Completed  (recommneded yearly) Microalb: normal 08/03/2019 Foot exam: 03/11/2020 Ingrown toenail> repeated today with chronic ingrown toenail right large toe> recommended podiatry referral Eye exam:  09/25/2019 Dr. Odis Hollingshead A1c: 7.3--> 6.4---> 7.6--> 7.9--> 7.9--> 8.5--> 10.3--> 6.8--> 7.6 >> 8.7> 6.8>7.1> 7.1 today   Morbid obesity (HCC)/Hyperlipidemia Continue statin Diet and  exercise modifications encouraged> they are going to start water exercises soon.  Lipid, cmp, cbc, tsh collected today  Shoulder pain:  Stable.  continue mobic.   Return in about 3 months (around 06/11/2020) for [facility] (30 min).   Orders Placed This Encounter  Procedures  . Comp Met (CMET)  . CBC  . TSH  . Lipid panel  . T4, free  . Ambulatory referral to Podiatry  . POCT glycosylated hemoglobin (Hb A1C)   Meds ordered this encounter  Medications  . glimepiride (AMARYL) 4 MG tablet    Sig: Take 2 tablets (8 mg total) by mouth daily with breakfast.    Dispense:  180 tablet    Refill:  1  .  metFORMIN (GLUCOPHAGE) 1000 MG tablet    Sig: 1000 mg BID with food    Dispense:  180 tablet    Refill:  1  . meloxicam (MOBIC) 15 MG tablet    Sig: Take 1 tablet (15 mg total) by mouth daily.    Dispense:  90 tablet    Refill:  1  . sitaGLIPtin (JANUVIA) 100 MG tablet    Sig: Take 1 tablet (100 mg total) by mouth daily.    Dispense:  90 tablet    Refill:  1  . dapagliflozin propanediol (FARXIGA) 10 MG TABS tablet    Sig: Take 1 tablet (10 mg total) by mouth daily.    Dispense:  90 tablet    Refill:  1    DC prior doses     Referral Orders     Ambulatory referral to Podiatry  electronically signed by:  Howard Pouch, DO  [place]

## 2020-03-11 NOTE — Patient Instructions (Signed)
increased farxiga dose to 10 mg a day (you can finish the farixga 5 mg pills you have by taking 2 tabs daily> new bottle will be 10 mg a tab (only take one then)  Referral to podiatry to address your toenail. They will call you to schedule.    Diabetes Mellitus and Foot Care Foot care is an important part of your health, especially when you have diabetes. Diabetes may cause you to have problems because of poor blood flow (circulation) to your feet and legs, which can cause your skin to:  Become thinner and drier.  Break more easily.  Heal more slowly.  Peel and crack. You may also have nerve damage (neuropathy) in your legs and feet, causing decreased feeling in them. This means that you may not notice minor injuries to your feet that could lead to more serious problems. Noticing and addressing any potential problems early is the best way to prevent future foot problems. How to care for your feet Foot hygiene  Wash your feet daily with warm water and mild soap. Do not use hot water. Then, pat your feet and the areas between your toes until they are completely dry. Do not soak your feet as this can dry your skin.  Trim your toenails straight across. Do not dig under them or around the cuticle. File the edges of your nails with an emery board or nail file.  Apply a moisturizing lotion or petroleum jelly to the skin on your feet and to dry, brittle toenails. Use lotion that does not contain alcohol and is unscented. Do not apply lotion between your toes. Shoes and socks  Wear clean socks or stockings every day. Make sure they are not too tight. Do not wear knee-high stockings since they may decrease blood flow to your legs.  Wear shoes that fit properly and have enough cushioning. Always look in your shoes before you put them on to be sure there are no objects inside.  To break in new shoes, wear them for just a few hours a day. This prevents injuries on your feet. Wounds, scrapes, corns,  and calluses  Check your feet daily for blisters, cuts, bruises, sores, and redness. If you cannot see the bottom of your feet, use a mirror or ask someone for help.  Do not cut corns or calluses or try to remove them with medicine.  If you find a minor scrape, cut, or break in the skin on your feet, keep it and the skin around it clean and dry. You may clean these areas with mild soap and water. Do not clean the area with peroxide, alcohol, or iodine.  If you have a wound, scrape, corn, or callus on your foot, look at it several times a day to make sure it is healing and not infected. Check for: ? Redness, swelling, or pain. ? Fluid or blood. ? Warmth. ? Pus or a bad smell. General instructions  Do not cross your legs. This may decrease blood flow to your feet.  Do not use heating pads or hot water bottles on your feet. They may burn your skin. If you have lost feeling in your feet or legs, you may not know this is happening until it is too late.  Protect your feet from hot and cold by wearing shoes, such as at the beach or on hot pavement.  Schedule a complete foot exam at least once a year (annually) or more often if you have foot problems. If you  have foot problems, report any cuts, sores, or bruises to your health care provider immediately. Contact a health care provider if:  You have a medical condition that increases your risk of infection and you have any cuts, sores, or bruises on your feet.  You have an injury that is not healing.  You have redness on your legs or feet.  You feel burning or tingling in your legs or feet.  You have pain or cramps in your legs and feet.  Your legs or feet are numb.  Your feet always feel cold.  You have pain around a toenail. Get help right away if:  You have a wound, scrape, corn, or callus on your foot and: ? You have pain, swelling, or redness that gets worse. ? You have fluid or blood coming from the wound, scrape, corn, or  callus. ? Your wound, scrape, corn, or callus feels warm to the touch. ? You have pus or a bad smell coming from the wound, scrape, corn, or callus. ? You have a fever. ? You have a red line going up your leg. Summary  Check your feet every day for cuts, sores, red spots, swelling, and blisters.  Moisturize feet and legs daily.  Wear shoes that fit properly and have enough cushioning.  If you have foot problems, report any cuts, sores, or bruises to your health care provider immediately.  Schedule a complete foot exam at least once a year (annually) or more often if you have foot problems. This information is not intended to replace advice given to you by your health care provider. Make sure you discuss any questions you have with your health care provider. Document Revised: 04/04/2019 Document Reviewed: 08/13/2016 Elsevier Patient Education  2020 [employer].

## 2020-03-12 ENCOUNTER — Telehealth: Payer: Self-pay | Admitting: Family Medicine

## 2020-03-12 NOTE — Telephone Encounter (Signed)
Please call patient Liver and  Kidney function are normal Blood cell counts and electrolytes are normal His thyroid levels are abnormal again, but not by much.  We will repeat thyroid levels at his next appointment for diabetes and discuss options on medication start at that time. Cholesterol panel overall looks good however his triglycerides are still elevated.  Starting a routine exercise regimen, following a diabetic diet and taking the omega-3/fish oil 1000-2000 mg daily will help lower triglycerides.

## 2020-03-12 NOTE — Telephone Encounter (Signed)
Patient advised and voiced understanding.  

## 2020-04-02 ENCOUNTER — Encounter: Payer: Self-pay | Admitting: Podiatry

## 2020-04-02 ENCOUNTER — Ambulatory Visit: Payer: Managed Care, Other (non HMO) | Admitting: Podiatry

## 2020-04-02 ENCOUNTER — Other Ambulatory Visit: Payer: Self-pay

## 2020-04-02 DIAGNOSIS — L600 Ingrowing nail: Secondary | ICD-10-CM

## 2020-04-02 MED ORDER — NEOMYCIN-POLYMYXIN-HC 3.5-10000-1 OT SOLN
OTIC | 0 refills | Status: DC
Start: 2020-04-02 — End: 2020-09-10

## 2020-04-02 NOTE — Patient Instructions (Signed)

## 2020-04-02 NOTE — Progress Notes (Signed)
Subjective:   Patient ID: Blake Davis, male   DOB: 44 y.o.   MRN: [id number]   HPI Patient states he is had a painful ingrown toenail of his right big toe and states that his A1c is running around 7 at this time   ROS      Objective:  Physical Exam  Neurovascular status intact with patient found to have incurvated right hallux medial border that is painful when pressed making shoe gear difficult with no active drainage or redness noted     Assessment:  Ingrown toenail deformity right hallux medial border with pain     Plan:  H&P reviewed condition recommended correction.  Patient wants surgery understanding risk and signed consent form after review and today I infiltrated the right hallux 60 mg like Marcaine mixture sterile prep done using sterile instrumentation remove border exposed matrix applied phenol 3 applications 30 seconds followed by alcohol lavage sterile dressing gave instructions on soaks leave dressing on 24 hours but take it off earlier if any drainage or pain were to occur and patient will be seen back and is encouraged to call with questions concerns

## 2020-04-16 ENCOUNTER — Ambulatory Visit: Payer: Managed Care, Other (non HMO) | Admitting: Physician Assistant

## 2020-05-28 ENCOUNTER — Ambulatory Visit: Payer: Managed Care, Other (non HMO) | Admitting: Physician Assistant

## 2020-06-11 ENCOUNTER — Ambulatory Visit: Payer: Managed Care, Other (non HMO) | Admitting: Family Medicine

## 2020-06-11 ENCOUNTER — Telehealth: Payer: Self-pay | Admitting: Family Medicine

## 2020-06-11 ENCOUNTER — Encounter: Payer: Self-pay | Admitting: Family Medicine

## 2020-06-11 ENCOUNTER — Other Ambulatory Visit: Payer: Self-pay

## 2020-06-11 VITALS — BP 121/75 | HR 80 | Temp 98.6°F | Ht 71.0 in | Wt 243.0 lb

## 2020-06-11 DIAGNOSIS — R7989 Other specified abnormal findings of blood chemistry: Secondary | ICD-10-CM | POA: Diagnosis not present

## 2020-06-11 DIAGNOSIS — E782 Mixed hyperlipidemia: Secondary | ICD-10-CM | POA: Diagnosis not present

## 2020-06-11 DIAGNOSIS — E118 Type 2 diabetes mellitus with unspecified complications: Secondary | ICD-10-CM

## 2020-06-11 DIAGNOSIS — E039 Hypothyroidism, unspecified: Secondary | ICD-10-CM

## 2020-06-11 LAB — POCT GLYCOSYLATED HEMOGLOBIN (HGB A1C)
HbA1c POC (<> result, manual entry): 6.7 % (ref 4.0–5.6)
HbA1c, POC (controlled diabetic range): 6.7 % (ref 0.0–7.0)
HbA1c, POC (prediabetic range): 6.7 % — AB (ref 5.7–6.4)
Hemoglobin A1C: 6.7 % — AB (ref 4.0–5.6)

## 2020-06-11 LAB — TSH: TSH: 5.59 u[IU]/mL — ABNORMAL HIGH (ref 0.35–4.50)

## 2020-06-11 MED ORDER — GLIMEPIRIDE 4 MG PO TABS: 8.0000 mg | ORAL_TABLET | Freq: Every day | ORAL | 1 refills | Status: DC

## 2020-06-11 MED ORDER — METFORMIN HCL 1000 MG PO TABS: ORAL_TABLET | ORAL | 1 refills | Status: DC

## 2020-06-11 MED ORDER — SITAGLIPTIN PHOSPHATE 100 MG PO TABS: 100.0000 mg | ORAL_TABLET | Freq: Every day | ORAL | 1 refills | Status: DC

## 2020-06-11 MED ORDER — DAPAGLIFLOZIN PROPANEDIOL 10 MG PO TABS: 10.0000 mg | ORAL_TABLET | Freq: Every day | ORAL | 1 refills | Status: DC

## 2020-06-11 MED ORDER — ATORVASTATIN CALCIUM 20 MG PO TABS: 20.0000 mg | ORAL_TABLET | Freq: Every day | ORAL | 3 refills | Status: DC

## 2020-06-11 MED ORDER — LEVOTHYROXINE SODIUM 25 MCG PO TABS: 25.0000 ug | ORAL_TABLET | Freq: Every day | ORAL | 1 refills | Status: DC

## 2020-06-11 NOTE — Progress Notes (Signed)
Patient ID: Blake Davis, male   DOB: 1975-08-08, 44 y.o.   MRN: [id number]    Blake Davis , Jan 17, 1976, 44 y.o., male MRN: [id number]  Chief Complaint  Patient presents with  . Follow-up    [facility]; pt is fasting   Patient Care Team    Relationship Specialty Notifications Start End  Natalia Leatherwood, DO PCP - General Family Medicine  12/02/15   Derenda Mis, PA-C  Dermatology  02/20/20     Subjective: [name] is a 44 y.o. male present for [facility] Diabetes type 2 with retinopathy/HLD Pt reports compliance  with metformin 1000 mg BID, Amaryl 4/4 mg BID, farxiga 10 and  Januvia. Patient denies dizziness, hyperglycemic or hypoglycemic events. Patient denies numbness, tingling in the extremities or nonhealing wounds of feet.  BG not routinely checked.  He has very mild chronic neuropathy.  He is prescribed statin and reports compliance  Lab Results  Component Value Date   HGBA1C 6.7 (A) 06/11/2020   HGBA1C 6.7 06/11/2020   HGBA1C 6.7 (A) 06/11/2020   HGBA1C 6.7 06/11/2020    Past Medical History:  Diagnosis Date  . Cholestasis 12/2018   per ct for kidney stone  . Diabetes mellitus without complication (HCC)   . Hepatic steatosis 12/2018   Extensive hepatic Seatosis per CT completed for kidney stone June 2020  . Ingrowing nail, left great toe   . Nephrolithiasis 12/2018   2 mm nonobstructive stone  . Visual changes 10/2015   Diabetic changes   Past Surgical History:  Procedure Laterality Date  . FOOT SURGERY  1988   mole/nevus  . NASAL SEPTUM SURGERY  1999   deviated   Family History  Problem Relation Age of Onset  . Diabetes Mother   . Heart disease Mother   . Heart attack Mother   . Alcohol abuse Paternal Uncle   . Dementia Paternal Grandmother   . Diabetes Paternal Grandfather   . Stroke Paternal Grandfather   . Cancer Paternal Grandfather   . Prostate cancer Paternal Grandfather    Allergies as of 06/11/2020   No Known Allergies      Medication List       Accurate as of June 11, 2020  8:48 AM. If you have any questions, ask your nurse or doctor.        atorvastatin 20 MG tablet Commonly known as: LIPITOR Take 1 tablet (20 mg total) by mouth daily.   dapagliflozin propanediol 10 MG Tabs tablet Commonly known as: Farxiga Take 1 tablet (10 mg total) by mouth daily.   Fish Oil 1000 MG Caps Take by mouth.   glimepiride 4 MG tablet Commonly known as: AMARYL Take 2 tablets (8 mg total) by mouth daily with breakfast.   meloxicam 15 MG tablet Commonly known as: MOBIC Take 1 tablet (15 mg total) by mouth daily.   metFORMIN 1000 MG tablet Commonly known as: GLUCOPHAGE 1000 mg BID with food   MULTI COMPLETE PO Take by mouth.   neomycin-polymyxin-hydrocortisone OTIC solution Commonly known as: CORTISPORIN 1-2 drops to toe twice daily after soaking   onetouch ultrasoft lancets   OneTouch Verio test strip Generic drug: glucose blood   pimecrolimus 1 % cream Commonly known as: Elidel Apply topically 2 (two) times daily.   sitaGLIPtin 100 MG tablet Commonly known as: JANUVIA Take 1 tablet (100 mg total) by mouth daily.       ROS: Negative, with the exception of above mentioned in HPI  Objective:  BP 121/75   Pulse 80   Temp 98.6 F (37 C) (Oral)   Ht 5\' 11"  (1.803 m)   Wt 243 lb (110.2 kg)   SpO2 98%   BMI 33.89 kg/m  Body mass index is 33.89 kg/m. Gen: Afebrile. No acute distress. Nontoxic. Pleasant male.  HENT: AT. [place]. Eyes:Pupils Equal Round Reactive to light, Extraocular movements intact,  Conjunctiva without redness, discharge or icterus. Neck/lymp/endocrine: Supple,no lymphadenopathy, no thyromegaly CV: RRR no murmur, no edema, +2/4 P posterior tibialis pulses Chest: CTAB, no wheeze or crackles Skin: no rashes, purpura or petechiae.  Neuro:  Normal gait. PERLA. EOMi. Alert. Oriented x3  Psych: Normal affect, dress and demeanor. Normal speech. Normal thought content and  judgment.  Results for orders placed or performed in visit on 06/11/20 (from the past 24 hour(s))  POCT HgB A1C     Status: Abnormal   Collection Time: 06/11/20  8:29 AM  Result Value Ref Range   Hemoglobin A1C 6.7 (A) 4.0 - 5.6 %   HbA1c POC (<> result, manual entry) 6.7 4.0 - 5.6 %   HbA1c, POC (prediabetic range) 6.7 (A) 5.7 - 6.4 %   HbA1c, POC (controlled diabetic range) 6.7 0.0 - 7.0 %    Assessment/Plan: Blake Davis is a 44 y.o. male present for OV for  Type 2 diabetes mellitus with complication, without long-term current use of insulin (HCC) Other diabetic neurological complication associated with type 2 diabetes mellitus (HCC) - stable.   - continue metformin 1000 mg BID.  - continue januvia 100 QD - continue  amaryl to 8 mg total daily- may take as BID if desired.  - continue  farxiga 10 mg  QD - continue  statin  - neuropathy stable> pt has declined gabapentin  PNA series:  completed. 04/04/2017 Flu shot: Completed  (recommneded yearly) Microalb: normal 08/03/2019 Foot exam: has est w/ podiatry> cracked/thickened nails. Encouraged him to see podiatry again for mail trim and diabetic foot exam.  Eye exam:  09/25/2019 Dr. 11/25/2019 A1c: 7.3--> 6.4---> 7.6--> 7.9--> 7.9--> 8.5--> 10.3--> 6.8--> 7.6 >> 8.7> 6.8>7.1> 7.1>6.7 today   Morbid obesity (HCC)/Hyperlipidemia Continue statin Diet and exercise modifications encouraged> they are going to start water exercises soon.   Return in about 3 months (around 09/11/2020) for [facility] (30 min).   Orders Placed This Encounter  Procedures  . TSH  . POCT HgB A1C   Meds ordered this encounter  Medications  . metFORMIN (GLUCOPHAGE) 1000 MG tablet    Sig: 1000 mg BID with food    Dispense:  180 tablet    Refill:  1  . sitaGLIPtin (JANUVIA) 100 MG tablet    Sig: Take 1 tablet (100 mg total) by mouth daily.    Dispense:  90 tablet    Refill:  1  . glimepiride (AMARYL) 4 MG tablet    Sig: Take 2 tablets (8 mg total) by mouth  daily with breakfast.    Dispense:  180 tablet    Refill:  1  . atorvastatin (LIPITOR) 20 MG tablet    Sig: Take 1 tablet (20 mg total) by mouth daily.    Dispense:  90 tablet    Refill:  3  . dapagliflozin propanediol (FARXIGA) 10 MG TABS tablet    Sig: Take 1 tablet (10 mg total) by mouth daily.    Dispense:  90 tablet    Refill:  1    DC prior doses    Referral Orders  No referral(s)  requested today    electronically signed by:  Felix Pacini, DO  [facility] - [place]

## 2020-06-11 NOTE — Patient Instructions (Addendum)
a1c looks great today! 6.7 Continue meds at current doses- all refilled for you.   We will call you with lab result and see if we need to start thyroid medication.    Next appt in 3-4 mos.

## 2020-06-11 NOTE — Telephone Encounter (Signed)
Please inform patient his TSH is still elevated above goal. I have called in levothyroxine 25 mcg daily to take daily on an empty stomach.  He should take this every morning prior to eating.  Attempt to avoid eating for at least 30 minutes after taking medication. We will retest his levels at his routine follow-up appointment.

## 2020-06-12 NOTE — Telephone Encounter (Signed)
Unable to LVM.

## 2020-06-12 NOTE — Telephone Encounter (Signed)
Spoke with pt regarding labs and instructions.   

## 2020-07-28 ENCOUNTER — Other Ambulatory Visit: Payer: Self-pay | Admitting: Family Medicine

## 2020-09-10 ENCOUNTER — Ambulatory Visit: Payer: BC Managed Care – PPO | Admitting: Family Medicine

## 2020-09-10 ENCOUNTER — Other Ambulatory Visit: Payer: Self-pay

## 2020-09-10 ENCOUNTER — Telehealth: Payer: Self-pay | Admitting: Family Medicine

## 2020-09-10 ENCOUNTER — Encounter: Payer: Self-pay | Admitting: Family Medicine

## 2020-09-10 VITALS — BP 123/78 | HR 76 | Temp 98.6°F | Ht 71.0 in | Wt 244.0 lb

## 2020-09-10 DIAGNOSIS — E1149 Type 2 diabetes mellitus with other diabetic neurological complication: Secondary | ICD-10-CM | POA: Diagnosis not present

## 2020-09-10 DIAGNOSIS — E782 Mixed hyperlipidemia: Secondary | ICD-10-CM

## 2020-09-10 DIAGNOSIS — E039 Hypothyroidism, unspecified: Secondary | ICD-10-CM | POA: Diagnosis not present

## 2020-09-10 DIAGNOSIS — E118 Type 2 diabetes mellitus with unspecified complications: Secondary | ICD-10-CM

## 2020-09-10 LAB — POCT GLYCOSYLATED HEMOGLOBIN (HGB A1C)
HbA1c POC (<> result, manual entry): 7 % (ref 4.0–5.6)
HbA1c, POC (controlled diabetic range): 7 % (ref 0.0–7.0)
HbA1c, POC (prediabetic range): 7 % — AB (ref 5.7–6.4)
Hemoglobin A1C: 7 % — AB (ref 4.0–5.6)

## 2020-09-10 LAB — MICROALBUMIN / CREATININE URINE RATIO
Creatinine,U: 104.5 mg/dL
Microalb Creat Ratio: 0.7 mg/g (ref 0.0–30.0)
Microalb, Ur: <0.7 mg/dL (ref 0.0–1.9)

## 2020-09-10 LAB — TSH: TSH: 4.53 u[IU]/mL — ABNORMAL HIGH (ref 0.35–4.50)

## 2020-09-10 MED ORDER — METFORMIN HCL 1000 MG PO TABS: ORAL_TABLET | ORAL | 1 refills | Status: DC

## 2020-09-10 MED ORDER — DAPAGLIFLOZIN PROPANEDIOL 10 MG PO TABS: 10.0000 mg | ORAL_TABLET | Freq: Every day | ORAL | 1 refills | Status: DC

## 2020-09-10 MED ORDER — LEVOTHYROXINE SODIUM 50 MCG PO TABS: 50.0000 ug | ORAL_TABLET | Freq: Every day | ORAL | 1 refills | Status: DC

## 2020-09-10 MED ORDER — GLIMEPIRIDE 4 MG PO TABS: 8.0000 mg | ORAL_TABLET | Freq: Every day | ORAL | 1 refills | Status: DC

## 2020-09-10 MED ORDER — SITAGLIPTIN PHOSPHATE 100 MG PO TABS: 100.0000 mg | ORAL_TABLET | Freq: Every day | ORAL | 1 refills | Status: DC

## 2020-09-10 NOTE — Progress Notes (Signed)
Patient ID: Blake Davis, male   DOB: [birth year], 45 y.o.   MRN: [id number]    Blake Davis , 08/13/1975, 45 y.o., male MRN: [id number]  Chief Complaint  Patient presents with  . Follow-up    [facility]; pt is fasting    Patient Care Team    Relationship Specialty Notifications Start End  Natalia Leatherwood, DO PCP - General Family Medicine  12/02/15   Clark-Burning, Victorino Dike, PA-C (Inactive)  Dermatology  02/20/20     Subjective: Blake Davis is a 45 y.o. male present for [facility] Diabetes type 2 with retinopathy/HLD/obesity Pt reports compliance  with metformin 1000 mg BID, Amaryl 4/4 mg BID, farxiga 10 and  Januvia. Patient denies new dizziness, hyperglycemic or hypoglycemic events. Patient denies numbness, tingling in the extremities or nonhealing wounds of feet.  BG not routinely checked.  He has very mild chronic neuropathy.  He is prescribed statin and reports compliance  Hypothyroid: Started levo 25 mcg qd. Retest due today.   Lab Results  Component Value Date   HGBA1C 7.0 (A) 09/10/2020   HGBA1C 7.0 09/10/2020   HGBA1C 7.0 (A) 09/10/2020   HGBA1C 7.0 09/10/2020    Past Medical History:  Diagnosis Date  . Cholestasis 12/2018   per ct for kidney stone  . Diabetes mellitus without complication (HCC)   . Hepatic steatosis 12/2018   Extensive hepatic Seatosis per CT completed for kidney stone June 2020  . Ingrowing nail, left great toe   . Injury of right foot 09/22/2018  . Nephrolithiasis 12/2018   2 mm nonobstructive stone  . Pain in joint of left shoulder 09/10/2019  . Pain in joint of right shoulder 09/10/2019  . Visual changes 10/2015   Diabetic changes   Past Surgical History:  Procedure Laterality Date  . FOOT SURGERY  1988   mole/nevus  . NASAL SEPTUM SURGERY  1999   deviated   Family History  Problem Relation Age of Onset  . Diabetes Mother   . Heart disease Mother   . Heart attack Mother   . Alcohol abuse Paternal Uncle   . Dementia Paternal  Grandmother   . Diabetes Paternal Grandfather   . Stroke Paternal Grandfather   . Cancer Paternal Grandfather   . Prostate cancer Paternal Grandfather    Allergies as of 09/10/2020   No Known Allergies     Medication List       Accurate as of September 10, 2020  9:20 AM. If you have any questions, ask your nurse or doctor.        STOP taking these medications   meloxicam 15 MG tablet Commonly known as: MOBIC Stopped by: Felix Pacini, DO   neomycin-polymyxin-hydrocortisone OTIC solution Commonly known as: CORTISPORIN Stopped by: Felix Pacini, DO     TAKE these medications   atorvastatin 20 MG tablet Commonly known as: LIPITOR Take 1 tablet (20 mg total) by mouth daily.   dapagliflozin propanediol 10 MG Tabs tablet Commonly known as: Farxiga Take 1 tablet (10 mg total) by mouth daily.   Fish Oil 1000 MG Caps Take by mouth.   glimepiride 4 MG tablet Commonly known as: AMARYL Take 2 tablets (8 mg total) by mouth daily with breakfast.   levothyroxine 25 MCG tablet Commonly known as: SYNTHROID Take 1 tablet (25 mcg total) by mouth daily before breakfast.   metFORMIN 1000 MG tablet Commonly known as: GLUCOPHAGE 1000 mg BID with food   MULTI COMPLETE PO Take by mouth.   onetouch  ultrasoft lancets   OneTouch Verio test strip Generic drug: glucose blood   pimecrolimus 1 % cream Commonly known as: Elidel Apply topically 2 (two) times daily.   sitaGLIPtin 100 MG tablet Commonly known as: JANUVIA Take 1 tablet (100 mg total) by mouth daily.       ROS: Negative, with the exception of above mentioned in HPI  Objective:  BP 123/78   Pulse 76   Temp 98.6 F (37 C) (Oral)   Ht 5\' 11"  (1.803 m)   Wt 244 lb (110.7 kg)   SpO2 98%   BMI 34.03 kg/m  Body mass index is 34.03 kg/m. Gen: Afebrile. No acute distress. Nontoxic, pleasant obese male.  HENT: AT. [place].. no cough or hoarseness Eyes:Pupils Equal Round Reactive to light, Extraocular movements intact,   Conjunctiva without redness, discharge or icterus. Neck/lymp/endocrine: Supple,no lymphadenopathy, no thyromegaly CV: RRR no murmur, no edema, +2/4 P posterior tibialis pulses Chest: CTAB, no wheeze or crackles Skin: no rashes, purpura or petechiae.  Neuro:  Normal gait. PERLA. EOMi. Alert. Oriented x3 Psych: Normal affect, dress and demeanor. Normal speech. Normal thought content and judgment.  Results for orders placed or performed in visit on 09/10/20 (from the past 24 hour(s))  POCT HgB A1C     Status: Abnormal   Collection Time: 09/10/20  8:47 AM  Result Value Ref Range   Hemoglobin A1C 7.0 (A) 4.0 - 5.6 %   HbA1c POC (<> result, manual entry) 7.0 4.0 - 5.6 %   HbA1c, POC (prediabetic range) 7.0 (A) 5.7 - 6.4 %   HbA1c, POC (controlled diabetic range) 7.0 0.0 - 7.0 %    Assessment/Plan: Blake Davis is a 45 y.o. male present for OV for  Type 2 diabetes mellitus with complication, without long-term current use of insulin (HCC) Other diabetic neurological complication associated with type 2 diabetes mellitus (HCC) - stable.   -continue metformin 1000 mg BID.  - continue  januvia 100 QD - continue   amaryl to 8 mg total daily- may take as BID if desired.  - continue  farxiga 10 mg  QD - continue   statin  - neuropathy stable> pt has declined gabapentin  PNA series:  completed. 04/04/2017 Flu shot: Completed  (recommneded yearly) Microalb: collected today Foot exam: UTD Eye exam:  09/25/2019 Dr. 11/25/2019 [place]> they are scheduling.  A1c: 7.3--> 6.4---> 7.6--> 7.9--> 7.9--> 8.5--> 10.3--> 6.8--> 7.6 >> 8.7> 6.8>7.1> 7.1>6.7> 7.0 today   Morbid obesity (HCC)/Hyperlipidemia Continue statin Diet and exercise modifications encouraged> they are going to start water exercises soon.   Hypothyroid: tsh collected today  Return in about 4 months (around 12/29/2020) for [facility] (30 min).   Orders Placed This Encounter  Procedures  . Urine Microalbumin w/creat. ratio  . TSH  . POCT HgB  A1C   Meds ordered this encounter  Medications  . sitaGLIPtin (JANUVIA) 100 MG tablet    Sig: Take 1 tablet (100 mg total) by mouth daily.    Dispense:  90 tablet    Refill:  1  . metFORMIN (GLUCOPHAGE) 1000 MG tablet    Sig: 1000 mg BID with food    Dispense:  180 tablet    Refill:  1  . glimepiride (AMARYL) 4 MG tablet    Sig: Take 2 tablets (8 mg total) by mouth daily with breakfast.    Dispense:  180 tablet    Refill:  1  . dapagliflozin propanediol (FARXIGA) 10 MG TABS tablet    Sig:  Take 1 tablet (10 mg total) by mouth daily.    Dispense:  90 tablet    Refill:  1    DC prior doses    Referral Orders  No referral(s) requested today    electronically signed by:  Felix Pacini, DO  [facility] [place]

## 2020-09-10 NOTE — Telephone Encounter (Signed)
Please inform patient His urinalysis is normal. His thyroid is still mildly above goal, increase dose to levothyroxine 50 mcg daily.  New bottle will be 50 mcg/pill.  -He can finish off the levothyroxine 25 mcg tabs he has by taking 2.

## 2020-09-10 NOTE — Patient Instructions (Signed)
a1c is 7 today. Good control. Continue current meds.   https://www.diabeteseducator.org/docs/default-source/living-with-diabetes/conquering-the-grocery-store-v1.pdf?sfvrsn=4">  Carbohydrate Counting for Diabetes Mellitus, Adult Carbohydrate counting is a method of keeping track of how many carbohydrates you eat. Eating carbohydrates naturally increases the amount of sugar (glucose) in the blood. Counting how many carbohydrates you eat improves your blood glucose control, which helps you manage your diabetes. It is important to know how many carbohydrates you can safely have in each meal. This is different for every person. A dietitian can help you make a meal plan and calculate how many carbohydrates you should have at each meal and snack. What foods contain carbohydrates? Carbohydrates are found in the following foods:  Grains, such as breads and cereals.  Dried beans and soy products.  Starchy vegetables, such as potatoes, peas, and corn.  Fruit and fruit juices.  Milk and yogurt.  Sweets and snack foods, such as cake, cookies, candy, chips, and soft drinks.   How do I count carbohydrates in foods? There are two ways to count carbohydrates in food. You can read food labels or learn standard serving sizes of foods. You can use either of the methods or a combination of both. Using the Nutrition Facts label The Nutrition Facts list is included on the labels of almost all packaged foods and beverages in the [place] It includes:  The serving size.  Information about nutrients in each serving, including the grams (g) of carbohydrate per serving. To use the Nutrition Facts:  Decide how many servings you will have.  Multiply the number of servings by the number of carbohydrates per serving.  The resulting number is the total amount of carbohydrates that you will be having. Learning the standard serving sizes of foods When you eat carbohydrate foods that are not packaged or do not include  Nutrition Facts on the label, you need to measure the servings in order to count the amount of carbohydrates.  Measure the foods that you will eat with a food scale or measuring cup, if needed.  Decide how many standard-size servings you will eat.  Multiply the number of servings by 15. For foods that contain carbohydrates, one serving equals 15 g of carbohydrates. ? For example, if you eat 2 cups or 10 oz (300 g) of strawberries, you will have eaten 2 servings and 30 g of carbohydrates (2 servings x 15 g = 30 g).  For foods that have more than one food mixed, such as soups and casseroles, you must count the carbohydrates in each food that is included. The following list contains standard serving sizes of common carbohydrate-rich foods. Each of these servings has about 15 g of carbohydrates:  1 slice of bread.  1 six-inch (15 cm) tortilla.  ? cup or 2 oz (53 g) cooked rice or pasta.   cup or 3 oz (85 g) cooked or canned, drained and rinsed beans or lentils.   cup or 3 oz (85 g) starchy vegetable, such as peas, corn, or squash.   cup or 4 oz (120 g) hot cereal.   cup or 3 oz (85 g) boiled or mashed potatoes, or  or 3 oz (85 g) of a large baked potato.   cup or 4 fl oz (118 mL) fruit juice.  1 cup or 8 fl oz (237 mL) milk.  1 small or 4 oz (106 g) apple.   or 2 oz (63 g) of a medium banana.  1 cup or 5 oz (150 g) strawberries.  3 cups or  1 oz (24 g) popped popcorn. What is an example of carbohydrate counting? To calculate the number of carbohydrates in this sample meal, follow the steps shown below. Sample meal  3 oz (85 g) chicken breast.  ? cup or 4 oz (106 g) brown rice.   cup or 3 oz (85 g) corn.  1 cup or 8 fl oz (237 mL) milk.  1 cup or 5 oz (150 g) strawberries with sugar-free whipped topping. Carbohydrate calculation 1. Identify the foods that contain carbohydrates: ? Rice. ? Corn. ? Milk. ? Strawberries. 2. Calculate how many servings you have of  each food: ? 2 servings rice. ? 1 serving corn. ? 1 serving milk. ? 1 serving strawberries. 3. Multiply each number of servings by 15 g: ? 2 servings rice x 15 g = 30 g. ? 1 serving corn x 15 g = 15 g. ? 1 serving milk x 15 g = 15 g. ? 1 serving strawberries x 15 g = 15 g. 4. Add together all of the amounts to find the total grams of carbohydrates eaten: ? 30 g + 15 g + 15 g + 15 g = 75 g of carbohydrates total. What are tips for following this plan? Shopping  Develop a meal plan and then make a shopping list.  Buy fresh and frozen vegetables, fresh and frozen fruit, dairy, eggs, beans, lentils, and whole grains.  Look at food labels. Choose foods that have more fiber and less sugar.  Avoid processed foods and foods with added sugars. Meal planning  Aim to have the same amount of carbohydrates at each meal and for each snack time.  Plan to have regular, balanced meals and snacks. Where to find more information  American Diabetes Association: www.diabetes.org  Centers for Disease Control and Prevention: [employer]Exhibition.com.br Summary  Carbohydrate counting is a method of keeping track of how many carbohydrates you eat.  Eating carbohydrates naturally increases the amount of sugar (glucose) in the blood.  Counting how many carbohydrates you eat improves your blood glucose control, which helps you manage your diabetes.  A dietitian can help you make a meal plan and calculate how many carbohydrates you should have at each meal and snack. This information is not intended to replace advice given to you by your health care provider. Make sure you discuss any questions you have with your health care provider. Document Revised: 07/12/2019 Document Reviewed: 07/13/2019 Elsevier Patient Education  2021 [employer].

## 2020-09-11 NOTE — Telephone Encounter (Signed)
LM for pt to return call to discuss.  

## 2020-09-12 ENCOUNTER — Telehealth: Payer: Self-pay

## 2020-09-12 NOTE — Telephone Encounter (Signed)
Spoke with pt regarding labs and instructions.   

## 2020-09-12 NOTE — Telephone Encounter (Signed)
Patient returning call about results  [phone]

## 2020-09-12 NOTE — Telephone Encounter (Signed)
MyChart message read.

## 2020-09-12 NOTE — Telephone Encounter (Signed)
LVM for pt to CB regarding results.  

## 2020-10-06 ENCOUNTER — Other Ambulatory Visit: Payer: Self-pay | Admitting: Family Medicine

## 2020-10-09 LAB — HM DIABETES EYE EXAM

## 2020-11-04 ENCOUNTER — Encounter: Payer: Self-pay | Admitting: Endocrinology

## 2020-12-29 ENCOUNTER — Ambulatory Visit: Payer: BC Managed Care – PPO | Admitting: Family Medicine

## 2020-12-29 ENCOUNTER — Encounter: Payer: Self-pay | Admitting: Family Medicine

## 2020-12-29 ENCOUNTER — Other Ambulatory Visit: Payer: Self-pay

## 2020-12-29 VITALS — BP 128/79 | HR 93 | Temp 98.6°F | Ht 71.0 in | Wt 242.0 lb

## 2020-12-29 DIAGNOSIS — E039 Hypothyroidism, unspecified: Secondary | ICD-10-CM

## 2020-12-29 DIAGNOSIS — E782 Mixed hyperlipidemia: Secondary | ICD-10-CM | POA: Diagnosis not present

## 2020-12-29 DIAGNOSIS — E1149 Type 2 diabetes mellitus with other diabetic neurological complication: Secondary | ICD-10-CM

## 2020-12-29 DIAGNOSIS — E118 Type 2 diabetes mellitus with unspecified complications: Secondary | ICD-10-CM

## 2020-12-29 LAB — POCT GLYCOSYLATED HEMOGLOBIN (HGB A1C)
HbA1c POC (<> result, manual entry): 6.3 % (ref 4.0–5.6)
HbA1c, POC (controlled diabetic range): 6.3 % (ref 0.0–7.0)
HbA1c, POC (prediabetic range): 6.3 % (ref 5.7–6.4)
Hemoglobin A1C: 6.3 % — AB (ref 4.0–5.6)

## 2020-12-29 MED ORDER — SITAGLIPTIN PHOSPHATE 100 MG PO TABS
100.0000 mg | ORAL_TABLET | Freq: Every day | ORAL | 1 refills | Status: DC
Start: 2020-12-29 — End: 2021-04-16

## 2020-12-29 MED ORDER — LEVOTHYROXINE SODIUM 50 MCG PO TABS: 50.0000 ug | ORAL_TABLET | Freq: Every day | ORAL | 1 refills | Status: DC

## 2020-12-29 MED ORDER — METFORMIN HCL 1000 MG PO TABS: ORAL_TABLET | ORAL | 1 refills | Status: DC

## 2020-12-29 MED ORDER — GLIMEPIRIDE 4 MG PO TABS: 8.0000 mg | ORAL_TABLET | Freq: Every day | ORAL | 1 refills | Status: DC

## 2020-12-29 MED ORDER — DAPAGLIFLOZIN PROPANEDIOL 10 MG PO TABS: 10.0000 mg | ORAL_TABLET | Freq: Every day | ORAL | 1 refills | Status: DC

## 2020-12-29 MED ORDER — ATORVASTATIN CALCIUM 20 MG PO TABS: 20.0000 mg | ORAL_TABLET | Freq: Every day | ORAL | 3 refills | Status: DC

## 2020-12-29 NOTE — Patient Instructions (Addendum)
Great job! A1c lowered to 6.3!!!  Next appt late September fasting labs will be due at that appt.     Diabetes Mellitus and Nutrition, Adult When you have diabetes, or diabetes mellitus, it is very important to have healthy eating habits because your blood sugar (glucose) levels are greatly affected by what you eat and drink. Eating healthy foods in the right amounts, at about the same times every day, can help you:  Control your blood glucose.  Lower your risk of heart disease.  Improve your blood pressure.  Reach or maintain a healthy weight. What can affect my meal plan? Every person with diabetes is different, and each person has different needs for a meal plan. Your health care provider may recommend that you work with a dietitian to make a meal plan that is best for you. Your meal plan may vary depending on factors such as:  The calories you need.  The medicines you take.  Your weight.  Your blood glucose, blood pressure, and cholesterol levels.  Your activity level.  Other health conditions you have, such as heart or kidney disease. How do carbohydrates affect me? Carbohydrates, also called carbs, affect your blood glucose level more than any other type of food. Eating carbs naturally raises the amount of glucose in your blood. Carb counting is a method for keeping track of how many carbs you eat. Counting carbs is important to keep your blood glucose at a healthy level, especially if you use insulin or take certain oral diabetes medicines. It is important to know how many carbs you can safely have in each meal. This is different for every person. Your dietitian can help you calculate how many carbs you should have at each meal and for each snack. How does alcohol affect me? Alcohol can cause a sudden decrease in blood glucose (hypoglycemia), especially if you use insulin or take certain oral diabetes medicines. Hypoglycemia can be a life-threatening condition. Symptoms of  hypoglycemia, such as sleepiness, dizziness, and confusion, are similar to symptoms of having too much alcohol.  Do not drink alcohol if: ? Your health care provider tells you not to drink. ? You are pregnant, may be pregnant, or are planning to become pregnant.  If you drink alcohol: ? Do not drink on an empty stomach. ? Limit how much you use to:  0-1 drink a day for women.  0-2 drinks a day for men. ? Be aware of how much alcohol is in your drink. In the [place] one drink equals one 12 oz bottle of beer (355 mL), one 5 oz glass of wine (148 mL), or one 1 oz glass of hard liquor (44 mL). ? Keep yourself hydrated with water, diet soda, or unsweetened iced tea.  Keep in mind that regular soda, juice, and other mixers may contain a lot of sugar and must be counted as carbs. What are tips for following this plan? Reading food labels  Start by checking the serving size on the "Nutrition Facts" label of packaged foods and drinks. The amount of calories, carbs, fats, and other nutrients listed on the label is based on one serving of the item. Many items contain more than one serving per package.  Check the total grams (g) of carbs in one serving. You can calculate the number of servings of carbs in one serving by dividing the total carbs by 15. For example, if a food has 30 g of total carbs per serving, it would be equal to 2  servings of carbs.  Check the number of grams (g) of saturated fats and trans fats in one serving. Choose foods that have a low amount or none of these fats.  Check the number of milligrams (mg) of salt (sodium) in one serving. Most people should limit total sodium intake to less than 2,300 mg per day.  Always check the nutrition information of foods labeled as "low-fat" or "nonfat." These foods may be higher in added sugar or refined carbs and should be avoided.  Talk to your dietitian to identify your daily goals for nutrients listed on the label. Shopping  Avoid  buying canned, pre-made, or processed foods. These foods tend to be high in fat, sodium, and added sugar.  Shop around the outside edge of the grocery store. This is where you will most often find fresh fruits and vegetables, bulk grains, fresh meats, and fresh dairy. Cooking  Use low-heat cooking methods, such as baking, instead of high-heat cooking methods like deep frying.  Cook using healthy oils, such as olive, canola, or sunflower oil.  Avoid cooking with butter, cream, or high-fat meats. Meal planning  Eat meals and snacks regularly, preferably at the same times every day. Avoid going long periods of time without eating.  Eat foods that are high in fiber, such as fresh fruits, vegetables, beans, and whole grains. Talk with your dietitian about how many servings of carbs you can eat at each meal.  Eat 4-6 oz (112-168 g) of lean protein each day, such as lean meat, chicken, fish, eggs, or tofu. One ounce (oz) of lean protein is equal to: ? 1 oz (28 g) of meat, chicken, or fish. ? 1 egg. ?  cup (62 g) of tofu.  Eat some foods each day that contain healthy fats, such as avocado, nuts, seeds, and fish.   What foods should I eat? Fruits Berries. Apples. Oranges. Peaches. Apricots. Plums. Grapes. Mango. Papaya. Pomegranate. Kiwi. Cherries. Vegetables Lettuce. Spinach. Leafy greens, including kale, chard, collard greens, and mustard greens. Beets. Cauliflower. Cabbage. Broccoli. Carrots. Green beans. Tomatoes. Peppers. Onions. Cucumbers. Brussels sprouts. Grains Whole grains, such as whole-wheat or whole-grain bread, crackers, tortillas, cereal, and pasta. Unsweetened oatmeal. Quinoa. Brown or wild rice. Meats and other proteins Seafood. Poultry without skin. Lean cuts of poultry and beef. Tofu. Nuts. Seeds. Dairy Low-fat or fat-free dairy products such as milk, yogurt, and cheese. The items listed above may not be a complete list of foods and beverages you can eat. Contact a  dietitian for more information. What foods should I avoid? Fruits Fruits canned with syrup. Vegetables Canned vegetables. Frozen vegetables with butter or cream sauce. Grains Refined white flour and flour products such as bread, pasta, snack foods, and cereals. Avoid all processed foods. Meats and other proteins Fatty cuts of meat. Poultry with skin. Breaded or fried meats. Processed meat. Avoid saturated fats. Dairy Full-fat yogurt, cheese, or milk. Beverages Sweetened drinks, such as soda or iced tea. The items listed above may not be a complete list of foods and beverages you should avoid. Contact a dietitian for more information. Questions to ask a health care provider  Do I need to meet with a diabetes educator?  Do I need to meet with a dietitian?  What number can I call if I have questions?  When are the best times to check my blood glucose? Where to find more information:  American Diabetes Association: diabetes.org  Academy of Nutrition and Dietetics: www.eatright.[employer] of Diabetes and  Digestive and Kidney Diseases: CarFlippers.tn  Association of Diabetes Care and Education Specialists: www.diabeteseducator.org Summary  It is important to have healthy eating habits because your blood sugar (glucose) levels are greatly affected by what you eat and drink.  A healthy meal plan will help you control your blood glucose and maintain a healthy lifestyle.  Your health care provider may recommend that you work with a dietitian to make a meal plan that is best for you.  Keep in mind that carbohydrates (carbs) and alcohol have immediate effects on your blood glucose levels. It is important to count carbs and to use alcohol carefully. This information is not intended to replace advice given to you by your health care provider. Make sure you discuss any questions you have with your health care provider. Document Revised: 06/19/2019 Document Reviewed:  06/19/2019 Elsevier Patient Education  2021 [employer].

## 2020-12-29 NOTE — Progress Notes (Signed)
Patient ID: Blake Davis, male   DOB: [birth year], 45 y.o.   MRN: [id number]    [name] , 12-31-1975, 45 y.o., male MRN: [id number]  Chief Complaint  Patient presents with  . Diabetes    [facility]; pt is fasting   Patient Care Team    Relationship Specialty Notifications Start End  Natalia Leatherwood, DO PCP - General Family Medicine  12/02/15   Clark-Burning, Victorino Dike, PA-C (Inactive)  Dermatology  02/20/20     Subjective: Blake Davis is a 45 y.o. male present for [facility] type 2 with retinopathy/HLD/obesity Pt reports compliance  with metformin 1000 mg BID, Amaryl 4/4 mg BID, farxiga 10 and  Januvia. Patient denies new dizziness, hyperglycemic or hypoglycemic events. Patient denies dizziness, hyperglycemic or hypoglycemic events. Patient denies numbness, tingling in the extremities or nonhealing wounds of feet.  BG not routinely checked.  He has very mild chronic neuropathy.  He is prescribed statin and reports compliance Labs due next visit.   Hypothyroid: Increased to levo 50 mcg last visit. Retest due today- he reports he has not been taking this routinely.   Lab Results  Component Value Date   HGBA1C 6.3 (A) 12/29/2020   HGBA1C 6.3 12/29/2020   HGBA1C 6.3 12/29/2020   HGBA1C 6.3 12/29/2020    Past Medical History:  Diagnosis Date  . Cholestasis 12/2018   per ct for kidney stone  . Diabetes mellitus without complication (HCC)   . Hepatic steatosis 12/2018   Extensive hepatic Seatosis per CT completed for kidney stone June 2020  . Ingrowing nail, left great toe   . Injury of right foot 09/22/2018  . Nephrolithiasis 12/2018   2 mm nonobstructive stone  . Pain in joint of left shoulder 09/10/2019  . Pain in joint of right shoulder 09/10/2019  . Visual changes 10/2015   Diabetic changes   Past Surgical History:  Procedure Laterality Date  . FOOT SURGERY  1988   mole/nevus  . NASAL SEPTUM SURGERY  1999   deviated   Family History  Problem Relation Age of  Onset  . Diabetes Mother   . Heart disease Mother   . Heart attack Mother   . Alcohol abuse Paternal Uncle   . Dementia Paternal Grandmother   . Diabetes Paternal Grandfather   . Stroke Paternal Grandfather   . Cancer Paternal Grandfather   . Prostate cancer Paternal Grandfather    Allergies as of 12/29/2020   No Known Allergies     Medication List       Accurate as of December 29, 2020  8:32 AM. If you have any questions, ask your nurse or doctor.        atorvastatin 20 MG tablet Commonly known as: LIPITOR Take 1 tablet (20 mg total) by mouth daily.   dapagliflozin propanediol 10 MG Tabs tablet Commonly known as: Farxiga Take 1 tablet (10 mg total) by mouth daily.   Fish Oil 1000 MG Caps Take by mouth.   glimepiride 4 MG tablet Commonly known as: AMARYL Take 2 tablets (8 mg total) by mouth daily with breakfast.   levothyroxine 50 MCG tablet Commonly known as: SYNTHROID Take 1 tablet (50 mcg total) by mouth daily before breakfast.   metFORMIN 1000 MG tablet Commonly known as: GLUCOPHAGE 1000 mg BID with food   MULTI COMPLETE PO Take by mouth.   onetouch ultrasoft lancets   OneTouch Verio test strip Generic drug: glucose blood   pimecrolimus 1 % cream Commonly known as:  Elidel Apply topically 2 (two) times daily.   sitaGLIPtin 100 MG tablet Commonly known as: JANUVIA Take 1 tablet (100 mg total) by mouth daily.       ROS: Negative, with the exception of above mentioned in HPI  Objective:  BP 128/79   Pulse 93   Temp 98.6 F (37 C) (Oral)   Ht 5\' 11"  (1.803 m)   Wt 242 lb (109.8 kg)   SpO2 98%   BMI 33.75 kg/m  Body mass index is 33.75 kg/m. Gen: Afebrile. No acute distress.  HENT: AT. [place].  Eyes:Pupils Equal Round Reactive to light, Extraocular movements intact,  Conjunctiva without redness, discharge or icterus. Neck/lymp/endocrine: Supple,no lymphadenopathy, no thyromegaly CV: RRR, no edema, +2/4 P posterior tibialis pulses Chest: CTAB, no  wheeze or crackles Neuro: Normal gait. PERLA. EOMi. Alert. Oriented x3 Psych: Normal affect, dress and demeanor. Normal speech. Normal thought content and judgment.  Results for orders placed or performed in visit on 12/29/20 (from the past 24 hour(s))  POCT HgB A1C     Status: Abnormal   Collection Time: 12/29/20  8:11 AM  Result Value Ref Range   Hemoglobin A1C 6.3 (A) 4.0 - 5.6 %   HbA1c POC (<> result, manual entry) 6.3 4.0 - 5.6 %   HbA1c, POC (prediabetic range) 6.3 5.7 - 6.4 %   HbA1c, POC (controlled diabetic range) 6.3 0.0 - 7.0 %    Assessment/Plan: Blake Davis is a 45 y.o. male present for OV for  Type 2 diabetes mellitus with complication, without long-term current use of insulin (HCC) Other diabetic neurological complication associated with type 2 diabetes mellitus (HCC) Stable- now at goal.   - continue metformin 1000 mg BID.  - continue   januvia 100 QD - continue    amaryl to 8 mg total daily- may take as BID if desired.  - continue   farxiga 10 mg  QD - continue   statin  - neuropathy stable> pt has declined gabapentin  PNA series:  completed. 04/04/2017 Flu shot: Completed  (recommneded yearly) Microalb: completed Foot exam: UTD Eye exam:  10/21/2020 [place], OD A1c: 7.3--> 6.4---> 7.6--> 7.9--> 7.9--> 8.5--> 10.3--> 6.8--> 7.6 >> 8.7> 6.8>7.1> 7.1>6.7> 7.0 >6.3 today !!  Morbid obesity (HCC)/Hyperlipidemia Stable.  Continue  statin Diet and exercise modifications encouraged> they are going to start water exercises soon.   Hypothyroid: Not taking med routinely.  Since he eats dinner early- he can consider taking med before bed at 1030-11. May be a better option then him not taking med at all.   Return in about 16 weeks (around 04/20/2021) for [facility] (30 min).   Orders Placed This Encounter  Procedures  . POCT HgB A1C   Meds ordered this encounter  Medications  . sitaGLIPtin (JANUVIA) 100 MG tablet    Sig: Take 1 tablet (100 mg total) by mouth  daily.    Dispense:  90 tablet    Refill:  1  . metFORMIN (GLUCOPHAGE) 1000 MG tablet    Sig: 1000 mg BID with food    Dispense:  180 tablet    Refill:  1  . levothyroxine (SYNTHROID) 50 MCG tablet    Sig: Take 1 tablet (50 mcg total) by mouth daily before breakfast.    Dispense:  90 tablet    Refill:  1    DC prior prescription change in dose  . glimepiride (AMARYL) 4 MG tablet    Sig: Take 2 tablets (8 mg total) by mouth  daily with breakfast.    Dispense:  180 tablet    Refill:  1  . dapagliflozin propanediol (FARXIGA) 10 MG TABS tablet    Sig: Take 1 tablet (10 mg total) by mouth daily.    Dispense:  90 tablet    Refill:  1    DC prior doses  . atorvastatin (LIPITOR) 20 MG tablet    Sig: Take 1 tablet (20 mg total) by mouth daily.    Dispense:  90 tablet    Refill:  3    Referral Orders  No referral(s) requested today    electronically signed by:  Felix Pacini, DO  [facility] - [place]

## 2021-02-12 IMAGING — CR DG FOOT COMPLETE 3+V*R*
3 series · 3 of 3 positions shown · non-contrast
Comparison: Right foot radiograph dated 08/12/2017

CLINICAL DATA: 42-year-old male stepped on a nail.

EXAM:
RIGHT FOOT COMPLETE - 3+ VIEW

[foot ap]
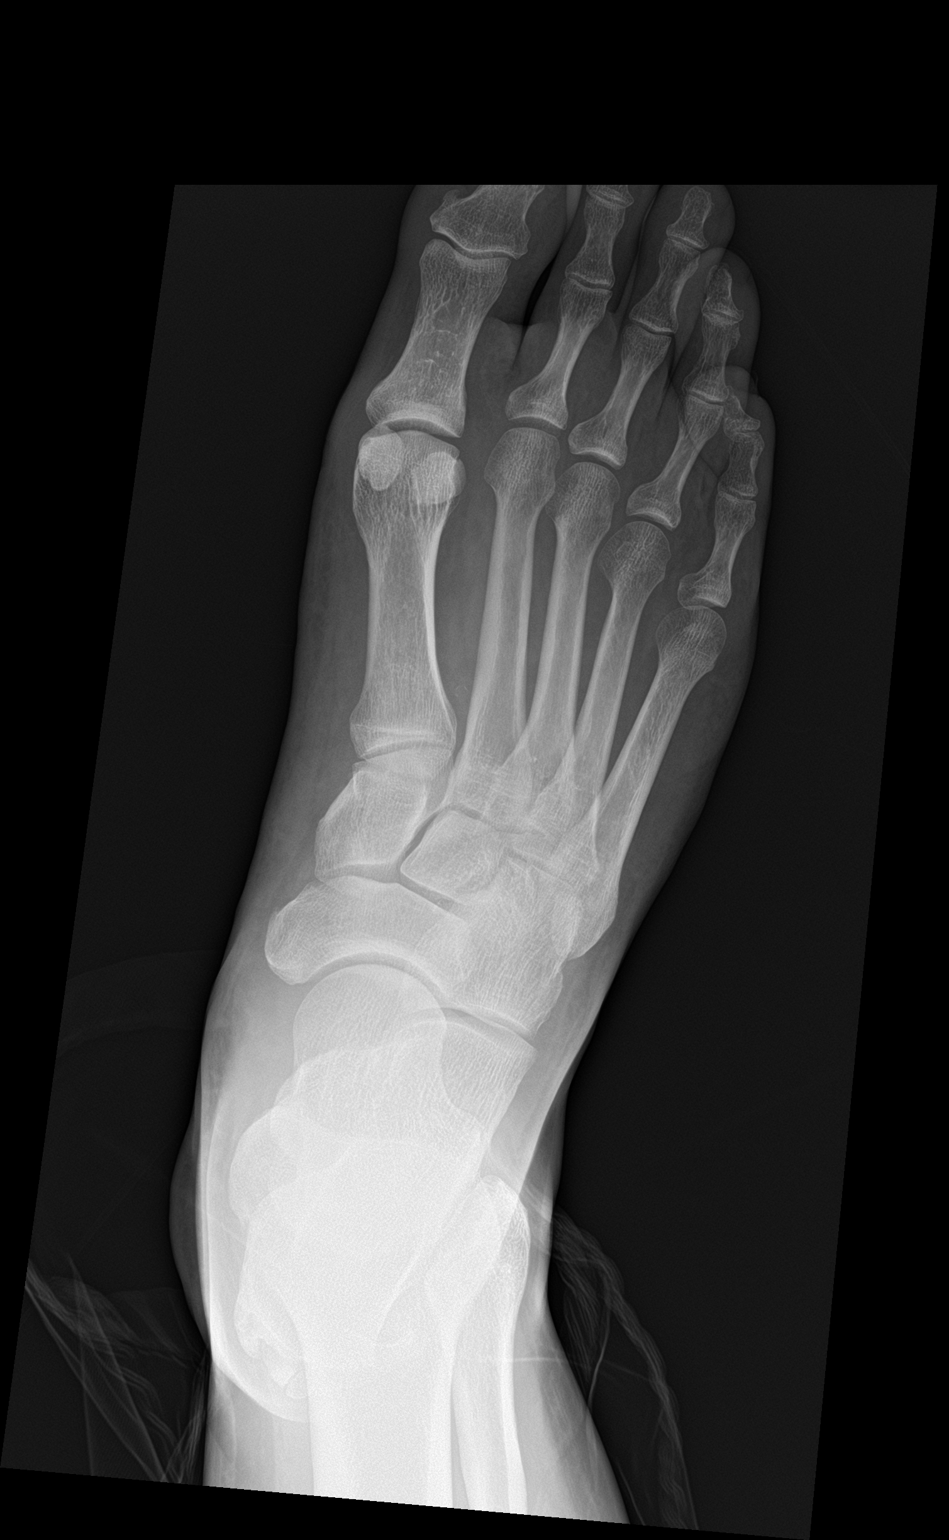

[foot obl]
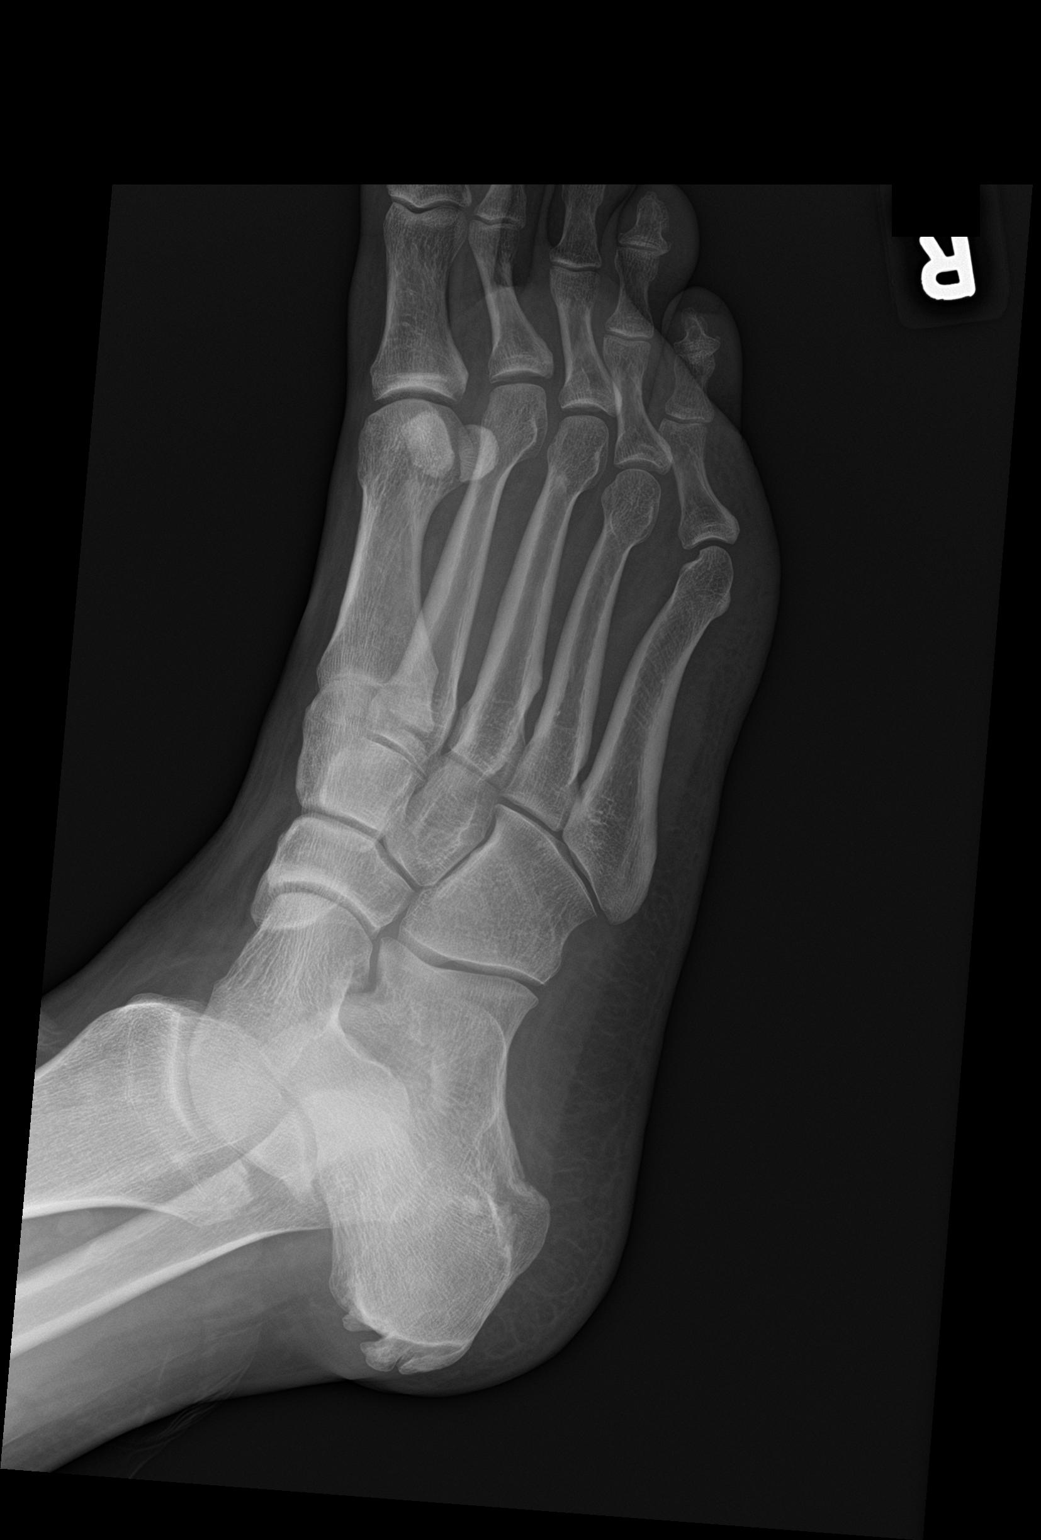

[foot lat]
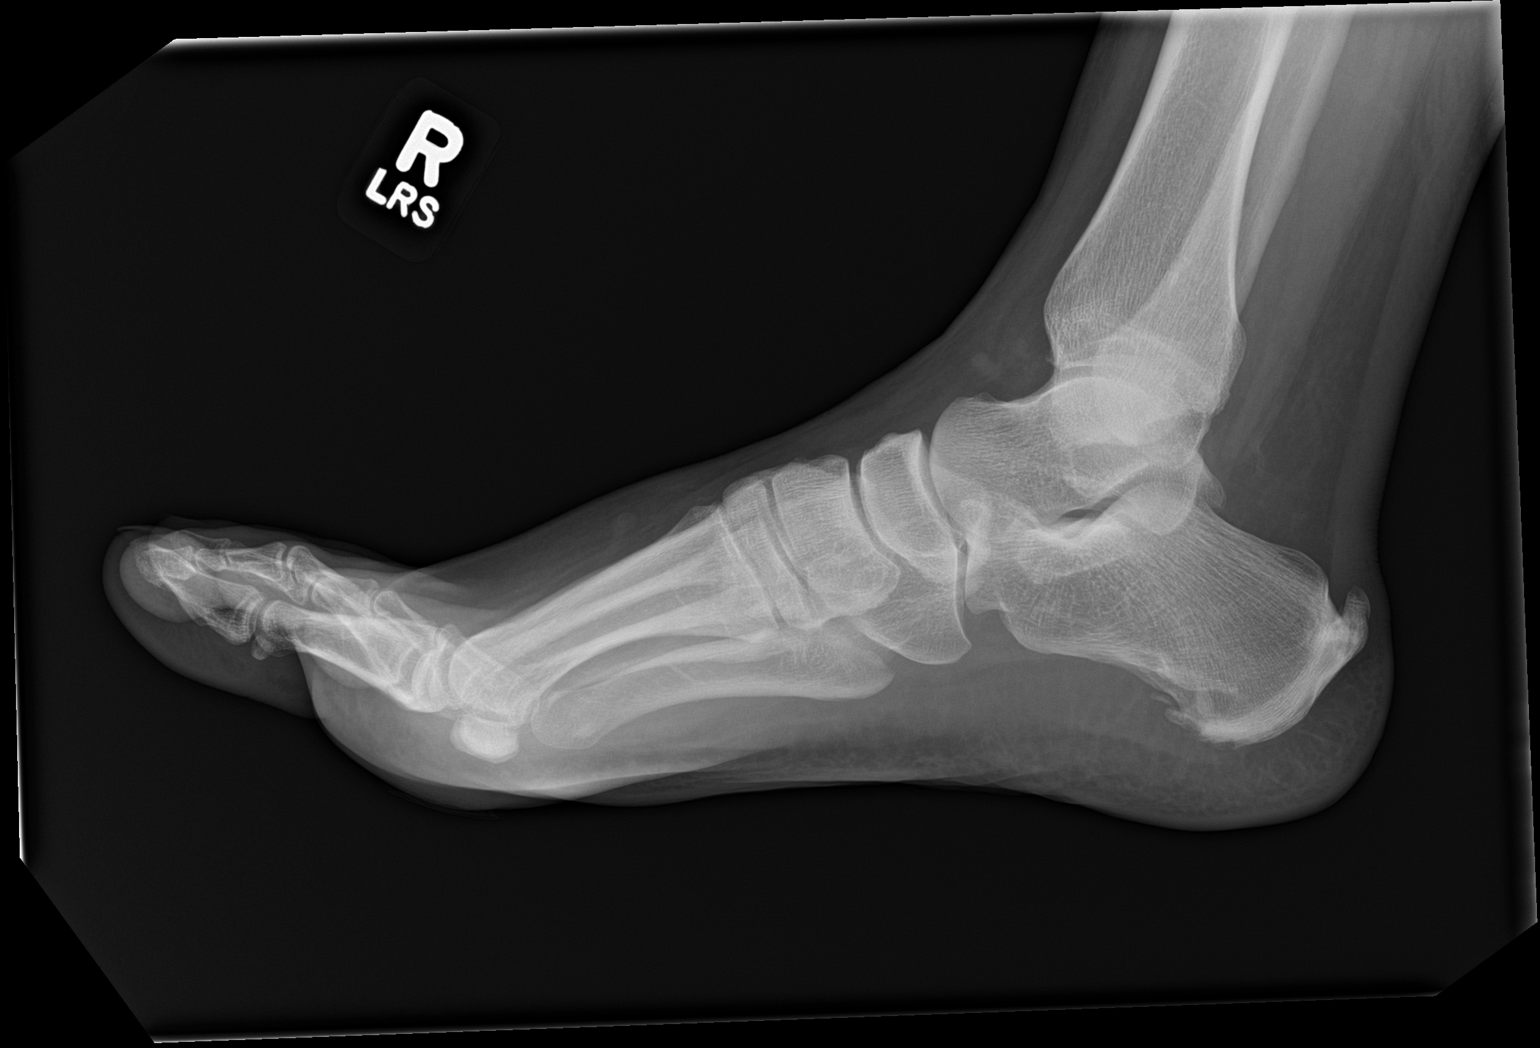

[3 of 3 positions shown; findings below may reference images not displayed]

FINDINGS: There is no evidence of fracture or dislocation. There is no
evidence of arthropathy or other focal bone abnormality. Soft
tissues are unremarkable.
IMPRESSION: Negative.

## 2021-04-17 ENCOUNTER — Ambulatory Visit: Payer: BLUE CROSS/BLUE SHIELD | Admitting: Family Medicine

## 2021-04-17 ENCOUNTER — Other Ambulatory Visit: Payer: Self-pay

## 2021-04-17 ENCOUNTER — Encounter: Payer: Self-pay | Admitting: Family Medicine

## 2021-04-17 ENCOUNTER — Other Ambulatory Visit: Payer: Self-pay | Admitting: Family Medicine

## 2021-04-17 VITALS — BP 121/72 | HR 71 | Temp 98.3°F | Ht 71.0 in | Wt 247.0 lb

## 2021-04-17 DIAGNOSIS — E785 Hyperlipidemia, unspecified: Secondary | ICD-10-CM | POA: Diagnosis not present

## 2021-04-17 DIAGNOSIS — E039 Hypothyroidism, unspecified: Secondary | ICD-10-CM

## 2021-04-17 DIAGNOSIS — G629 Polyneuropathy, unspecified: Secondary | ICD-10-CM | POA: Diagnosis not present

## 2021-04-17 DIAGNOSIS — E1149 Type 2 diabetes mellitus with other diabetic neurological complication: Secondary | ICD-10-CM

## 2021-04-17 DIAGNOSIS — Z23 Encounter for immunization: Secondary | ICD-10-CM | POA: Diagnosis not present

## 2021-04-17 DIAGNOSIS — E118 Type 2 diabetes mellitus with unspecified complications: Secondary | ICD-10-CM

## 2021-04-17 DIAGNOSIS — E1169 Type 2 diabetes mellitus with other specified complication: Secondary | ICD-10-CM | POA: Diagnosis not present

## 2021-04-17 LAB — BASIC METABOLIC PANEL
BUN: 14 mg/dL (ref 6–23)
CO2: 26 mEq/L (ref 19–32)
Calcium: 9.8 mg/dL (ref 8.4–10.5)
Chloride: 99 mEq/L (ref 96–112)
Creatinine, Ser: 0.99 mg/dL (ref 0.40–1.50)
GFR: 90.96 mL/min (ref >60.00)
Glucose, Bld: 181 mg/dL — ABNORMAL HIGH (ref 70–99)
Potassium: 4.4 mEq/L (ref 3.5–5.1)
Sodium: 135 mEq/L (ref 135–145)

## 2021-04-17 LAB — CBC WITH DIFFERENTIAL/PLATELET
Basophils Absolute: 0.1 10*3/uL (ref 0.0–0.1)
Basophils Relative: 2 % (ref 0.0–3.0)
Eosinophils Absolute: 0.3 10*3/uL (ref 0.0–0.7)
Eosinophils Relative: 4.6 % (ref 0.0–5.0)
HCT: 45.5 % (ref 39.0–52.0)
Hemoglobin: 15.1 g/dL (ref 13.0–17.0)
Lymphocytes Relative: 27.2 % (ref 12.0–46.0)
Lymphs Abs: 2 10*3/uL (ref 0.7–4.0)
MCHC: 33.1 g/dL (ref 30.0–36.0)
MCV: 87.7 fl (ref 78.0–100.0)
Monocytes Absolute: 0.5 10*3/uL (ref 0.1–1.0)
Monocytes Relative: 6.8 % (ref 3.0–12.0)
Neutro Abs: 4.3 10*3/uL (ref 1.4–7.7)
Neutrophils Relative %: 59.4 % (ref 43.0–77.0)
Platelets: 189 10*3/uL (ref 150.0–400.0)
RBC: 5.18 Mil/uL (ref 4.22–5.81)
RDW: 13.1 % (ref 11.5–15.5)
WBC: 7.2 10*3/uL (ref 4.0–10.5)

## 2021-04-17 LAB — POCT GLYCOSYLATED HEMOGLOBIN (HGB A1C)
HbA1c POC (<> result, manual entry): 7.2 % (ref 4.0–5.6)
HbA1c, POC (controlled diabetic range): 7.2 % — AB (ref 0.0–7.0)
HbA1c, POC (prediabetic range): 7.2 % — AB (ref 5.7–6.4)
Hemoglobin A1C: 7.2 % — AB (ref 4.0–5.6)

## 2021-04-17 LAB — LIPID PANEL
Cholesterol: 127 mg/dL (ref 0–200)
HDL: 35.2 mg/dL — ABNORMAL LOW (ref >39.00)
LDL Cholesterol: 52 mg/dL (ref 0–99)
NonHDL: 91.4
Total CHOL/HDL Ratio: 4
Triglycerides: 197 mg/dL — ABNORMAL HIGH (ref 0.0–149.0)
VLDL: 39.4 mg/dL (ref 0.0–40.0)

## 2021-04-17 LAB — TSH: TSH: 4.04 u[IU]/mL (ref 0.35–5.50)

## 2021-04-17 MED ORDER — LEVOTHYROXINE SODIUM 50 MCG PO TABS: 50.0000 ug | ORAL_TABLET | Freq: Every day | ORAL | 3 refills | Status: DC

## 2021-04-17 MED ORDER — METFORMIN HCL 1000 MG PO TABS: ORAL_TABLET | ORAL | 1 refills | Status: DC

## 2021-04-17 MED ORDER — GLIMEPIRIDE 4 MG PO TABS: 8.0000 mg | ORAL_TABLET | Freq: Every day | ORAL | 1 refills | Status: DC

## 2021-04-17 MED ORDER — DAPAGLIFLOZIN PROPANEDIOL 10 MG PO TABS: 10.0000 mg | ORAL_TABLET | Freq: Every day | ORAL | 1 refills | Status: DC

## 2021-04-17 MED ORDER — SITAGLIPTIN PHOSPHATE 100 MG PO TABS: 100.0000 mg | ORAL_TABLET | Freq: Every day | ORAL | 1 refills | Status: DC

## 2021-04-17 NOTE — Patient Instructions (Signed)
Great to see you today.  I have refilled the medication(s) we provide.   If labs were collected, we will inform you of lab results once received either by echart message or telephone call.   - echart message- for normal results that have been seen by the patient already.   - telephone call: abnormal results or if patient has not viewed results in their echart.   A1c went up to 7.2. Focus on diabetic diet and exercise. No changes in meds at this time.   Follow up in 5.5 mos.    Diabetes Mellitus and Nutrition, Adult When you have diabetes, or diabetes mellitus, it is very important to have healthy eating habits because your blood sugar (glucose) levels are greatly affected by what you eat and drink. Eating healthy foods in the right amounts, at about the same times every day, can help you: Control your blood glucose. Lower your risk of heart disease. Improve your blood pressure. Reach or maintain a healthy weight. What can affect my meal plan? Every person with diabetes is different, and each person has different needs for a meal plan. Your health care provider may recommend that you work with a dietitian to make a meal plan that is best for you. Your meal plan may vary depending on factors such as: The calories you need. The medicines you take. Your weight. Your blood glucose, blood pressure, and cholesterol levels. Your activity level. Other health conditions you have, such as heart or kidney disease. How do carbohydrates affect me? Carbohydrates, also called carbs, affect your blood glucose level more than any other type of food. Eating carbs naturally raises the amount of glucose in your blood. Carb counting is a method for keeping track of how many carbs you eat. Counting carbs is important to keep your blood glucose at a healthy level, especially if you use insulin or take certain oral diabetes medicines. It is important to know how many carbs you can safely have in each meal.  This is different for every person. Your dietitian can help you calculate how many carbs you should have at each meal and for each snack. How does alcohol affect me? Alcohol can cause a sudden decrease in blood glucose (hypoglycemia), especially if you use insulin or take certain oral diabetes medicines. Hypoglycemia can be a life-threatening condition. Symptoms of hypoglycemia, such as sleepiness, dizziness, and confusion, are similar to symptoms of having too much alcohol. Do not drink alcohol if: Your health care provider tells you not to drink. You are pregnant, may be pregnant, or are planning to become pregnant. If you drink alcohol: Do not drink on an empty stomach. Limit how much you use to: 0-1 drink a day for women. 0-2 drinks a day for men. Be aware of how much alcohol is in your drink. In the [place] one drink equals one 12 oz bottle of beer (355 mL), one 5 oz glass of wine (148 mL), or one 1 oz glass of hard liquor (44 mL). Keep yourself hydrated with water, diet soda, or unsweetened iced tea. Keep in mind that regular soda, juice, and other mixers may contain a lot of sugar and must be counted as carbs. What are tips for following this plan? Reading food labels Start by checking the serving size on the "Nutrition Facts" label of packaged foods and drinks. The amount of calories, carbs, fats, and other nutrients listed on the label is based on one serving of the item. Many items contain more than  one serving per package. Check the total grams (g) of carbs in one serving. You can calculate the number of servings of carbs in one serving by dividing the total carbs by 15. For example, if a food has 30 g of total carbs per serving, it would be equal to 2 servings of carbs. Check the number of grams (g) of saturated fats and trans fats in one serving. Choose foods that have a low amount or none of these fats. Check the number of milligrams (mg) of salt (sodium) in one serving. Most people  should limit total sodium intake to less than 2,300 mg per day. Always check the nutrition information of foods labeled as "low-fat" or "nonfat." These foods may be higher in added sugar or refined carbs and should be avoided. Talk to your dietitian to identify your daily goals for nutrients listed on the label. Shopping Avoid buying canned, pre-made, or processed foods. These foods tend to be high in fat, sodium, and added sugar. Shop around the outside edge of the grocery store. This is where you will most often find fresh fruits and vegetables, bulk grains, fresh meats, and fresh dairy. Cooking Use low-heat cooking methods, such as baking, instead of high-heat cooking methods like deep frying. Cook using healthy oils, such as olive, canola, or sunflower oil. Avoid cooking with butter, cream, or high-fat meats. Meal planning Eat meals and snacks regularly, preferably at the same times every day. Avoid going long periods of time without eating. Eat foods that are high in fiber, such as fresh fruits, vegetables, beans, and whole grains. Talk with your dietitian about how many servings of carbs you can eat at each meal. Eat 4-6 oz (112-168 g) of lean protein each day, such as lean meat, chicken, fish, eggs, or tofu. One ounce (oz) of lean protein is equal to: 1 oz (28 g) of meat, chicken, or fish. 1 egg.  cup (62 g) of tofu. Eat some foods each day that contain healthy fats, such as avocado, nuts, seeds, and fish. What foods should I eat? Fruits Berries. Apples. Oranges. Peaches. Apricots. Plums. Grapes. Mango. Papaya. Pomegranate. Kiwi. Cherries. Vegetables Lettuce. Spinach. Leafy greens, including kale, chard, collard greens, and mustard greens. Beets. Cauliflower. Cabbage. Broccoli. Carrots. Green beans. Tomatoes. Peppers. Onions. Cucumbers. Brussels sprouts. Grains Whole grains, such as whole-wheat or whole-grain bread, crackers, tortillas, cereal, and pasta. Unsweetened oatmeal. Quinoa.  Brown or wild rice. Meats and other proteins Seafood. Poultry without skin. Lean cuts of poultry and beef. Tofu. Nuts. Seeds. Dairy Low-fat or fat-free dairy products such as milk, yogurt, and cheese. The items listed above may not be a complete list of foods and beverages you can eat. Contact a dietitian for more information. What foods should I avoid? Fruits Fruits canned with syrup. Vegetables Canned vegetables. Frozen vegetables with butter or cream sauce. Grains Refined white flour and flour products such as bread, pasta, snack foods, and cereals. Avoid all processed foods. Meats and other proteins Fatty cuts of meat. Poultry with skin. Breaded or fried meats. Processed meat. Avoid saturated fats. Dairy Full-fat yogurt, cheese, or milk. Beverages Sweetened drinks, such as soda or iced tea. The items listed above may not be a complete list of foods and beverages you should avoid. Contact a dietitian for more information. Questions to ask a health care provider Do I need to meet with a diabetes educator? Do I need to meet with a dietitian? What number can I call if I have questions? When are the  best times to check my blood glucose? Where to find more information: American Diabetes Association: diabetes.org Academy of Nutrition and Dietetics: www.eatright.[employer] of Diabetes and Digestive and Kidney Diseases: CarFlippers.tn Association of Diabetes Care and Education Specialists: www.diabeteseducator.org Summary It is important to have healthy eating habits because your blood sugar (glucose) levels are greatly affected by what you eat and drink. A healthy meal plan will help you control your blood glucose and maintain a healthy lifestyle. Your health care provider may recommend that you work with a dietitian to make a meal plan that is best for you. Keep in mind that carbohydrates (carbs) and alcohol have immediate effects on your blood glucose levels. It is  important to count carbs and to use alcohol carefully. This information is not intended to replace advice given to you by your health care provider. Make sure you discuss any questions you have with your health care provider. Document Revised: 06/19/2019 Document Reviewed: 06/19/2019 Elsevier Patient Education  2021 [employer].

## 2021-04-17 NOTE — Progress Notes (Signed)
Patient ID: MURRELL DOME, male   DOB: 1975-08-03, 45 y.o.   MRN: [id number]    ROTH RESS , Feb 10, 1976, 45 y.o., male MRN: [id number]  Chief Complaint  Patient presents with   Diabetes    [facility]; pt is fasting   Patient Care Team    Relationship Specialty Notifications Start End  Natalia Leatherwood, DO PCP - General Family Medicine  12/02/15   Derenda Mis, PA-C (Inactive)  Dermatology  02/20/20     Subjective: [name] is a 45 y.o. male present for [facility] Diabetes type 2 with retinopathy/HLD/obesity Pt reports compliance with metformin 1000 mg BID, Amaryl 4/4 mg BID, farxiga 10 and  Januvia. Patient denies dizziness, hyperglycemic or hypoglycemic events. Patient denies numbness, tingling in the extremities or nonhealing wounds of feet.  BG not routinely checked.  He has very mild chronic neuropathy.  He is prescribed statin and reports compliance   Hypothyroid: Increased to levo 50 mcg last visit.  Patient reports he has been more compliant with the medication.  Lab Results  Component Value Date   HGBA1C 7.2 (A) 04/17/2021   HGBA1C 7.2 04/17/2021   HGBA1C 7.2 (A) 04/17/2021   HGBA1C 7.2 (A) 04/17/2021    Past Medical History:  Diagnosis Date   Cholestasis 12/2018   per ct for kidney stone   Diabetes mellitus without complication (HCC)    Hepatic steatosis 12/2018   Extensive hepatic Seatosis per CT completed for kidney stone June 2020   Ingrowing nail, left great toe    Injury of right foot 09/22/2018   Nephrolithiasis 12/2018   2 mm nonobstructive stone   Pain in joint of left shoulder 09/10/2019   Pain in joint of right shoulder 09/10/2019   Visual changes 10/2015   Diabetic changes   Past Surgical History:  Procedure Laterality Date   FOOT SURGERY  1988   mole/nevus   NASAL SEPTUM SURGERY  1999   deviated   Family History  Problem Relation Age of Onset   Diabetes Mother    Heart disease Mother    Heart attack Mother    Alcohol abuse  Paternal Uncle    Dementia Paternal Grandmother    Diabetes Paternal Grandfather    Stroke Paternal Grandfather    Cancer Paternal Grandfather    Prostate cancer Paternal Grandfather    Allergies as of 04/17/2021   No Known Allergies      Medication List        Accurate as of April 17, 2021 12:21 PM. If you have any questions, ask your nurse or doctor.          atorvastatin 20 MG tablet Commonly known as: LIPITOR Take 1 tablet (20 mg total) by mouth daily.   dapagliflozin propanediol 10 MG Tabs tablet Commonly known as: Farxiga Take 1 tablet (10 mg total) by mouth daily.   Fish Oil 1000 MG Caps Take by mouth.   glimepiride 4 MG tablet Commonly known as: AMARYL Take 2 tablets (8 mg total) by mouth daily with breakfast.   levothyroxine 50 MCG tablet Commonly known as: SYNTHROID Take 1 tablet (50 mcg total) by mouth daily before breakfast.   metFORMIN 1000 MG tablet Commonly known as: GLUCOPHAGE 1000 mg BID with food   MULTI COMPLETE PO Take by mouth.   onetouch ultrasoft lancets   OneTouch Verio test strip Generic drug: glucose blood   pimecrolimus 1 % cream Commonly known as: Elidel Apply topically 2 (two) times daily.   sitaGLIPtin  100 MG tablet Commonly known as: JANUVIA Take 1 tablet (100 mg total) by mouth daily.        ROS: Negative, with the exception of above mentioned in HPI  Objective:  BP 121/72   Pulse 71   Temp 98.3 F (36.8 C) (Oral)   Ht 5\' 11"  (1.803 m)   Wt 247 lb (112 kg)   SpO2 98%   BMI 34.45 kg/m  Body mass index is 34.45 kg/m. Gen: Afebrile. No acute distress.  Pleasant obese male HENT: AT. [place].  Eyes:Pupils Equal Round Reactive to light, Extraocular movements intact,  Conjunctiva without redness, discharge or icterus. CV: RRR no murmur, no edema, +2/4 P posterior tibialis pulses Chest: CTAB, no wheeze or crackles Skin: No rashes, purpura or petechiae.  Neuro:  Normal gait. PERLA. EOMi. Alert. Oriented  x3 Psych: Normal affect, dress and demeanor. Normal speech. Normal thought content and judgment..  Diabetic Foot Exam - Simple   Simple Foot Form Diabetic Foot exam was performed with the following findings: Yes 04/17/2021  8:22 AM  Visual Inspection No deformities, no ulcerations, no other skin breakdown bilaterally: Yes Sensation Testing Intact to touch and monofilament testing bilaterally: Yes Pulse Check Posterior Tibialis and Dorsalis pulse intact bilaterally: Yes Comments      Results for orders placed or performed in visit on 04/17/21 (from the past 24 hour(s))  POCT HgB A1C     Status: Abnormal   Collection Time: 04/17/21  8:17 AM  Result Value Ref Range   Hemoglobin A1C 7.2 (A) 4.0 - 5.6 %   HbA1c POC (<> result, manual entry) 7.2 4.0 - 5.6 %   HbA1c, POC (prediabetic range) 7.2 (A) 5.7 - 6.4 %   HbA1c, POC (controlled diabetic range) 7.2 (A) 0.0 - 7.0 %    Assessment/Plan: MARTAVIS GURNEY is a 45 y.o. male present for OV for  Type 2 diabetes mellitus with complication, without long-term current use of insulin (HCC) Other diabetic neurological complication associated with type 2 diabetes mellitus (HCC) Increased from last visit, but still has good control.  Encouraged him to increase his exercise and make sure to follow a diabetic diet. -Continue metformin 1000 mg BID.  -Continue januvia 100 QD -Continue amaryl to 8 mg total daily- may take as BID if desired.  -Continue farxiga 10 mg  QD -Continue statin  - neuropathy stable> pt has declined gabapentin  PNA series:  completed. 04/04/2017 Flu shot: Completed  (recommneded yearly) Microalb: completed Foot exam: UTD today Eye exam:  10/21/2020 Heisler, OD A1c: 7.3--> 6.4---> 7.6--> 7.9--> 7.9--> 8.5--> 10.3--> 6.8--> 7.6 >> 8.7> 6.8>7.1> 7.1>6.7> 7.0 >6.3 >7.2 today !!  Morbid obesity (HCC)/Hyperlipidemia Stable Continue statin CBC, CMP, lipid panel collected today Diet and exercise modifications encouraged> they  are going to start water exercises soon.   Hypothyroid: Now reports compliance with levothyroxine 50 mcg.   Repeat TSH-medication will be called in after results and appropriate dose.  Return in about 5 months (around 10/01/2021).   Orders Placed This Encounter  Procedures   Flu Vaccine QUAD 6+ mos PF IM (Fluarix Quad PF)   TSH   CBC w/Diff   Basic Metabolic Panel (BMET)   Lipid panel   Ambulatory referral to Podiatry   POCT HgB A1C   Meds ordered this encounter  Medications   dapagliflozin propanediol (FARXIGA) 10 MG TABS tablet    Sig: Take 1 tablet (10 mg total) by mouth daily.    Dispense:  90 tablet  Refill:  1   glimepiride (AMARYL) 4 MG tablet    Sig: Take 2 tablets (8 mg total) by mouth daily with breakfast.    Dispense:  180 tablet    Refill:  1   metFORMIN (GLUCOPHAGE) 1000 MG tablet    Sig: 1000 mg BID with food    Dispense:  180 tablet    Refill:  1   sitaGLIPtin (JANUVIA) 100 MG tablet    Sig: Take 1 tablet (100 mg total) by mouth daily.    Dispense:  90 tablet    Refill:  1     Referral Orders         Ambulatory referral to Podiatry      electronically signed by:  Felix Pacini, DO  [facility] [place]

## 2021-05-20 ENCOUNTER — Ambulatory Visit: Payer: BC Managed Care – PPO | Admitting: Podiatry

## 2021-05-20 ENCOUNTER — Encounter: Payer: Self-pay | Admitting: Podiatry

## 2021-05-20 ENCOUNTER — Other Ambulatory Visit: Payer: Self-pay

## 2021-05-20 DIAGNOSIS — S90222A Contusion of left lesser toe(s) with damage to nail, initial encounter: Secondary | ICD-10-CM | POA: Diagnosis not present

## 2021-05-20 DIAGNOSIS — E1142 Type 2 diabetes mellitus with diabetic polyneuropathy: Secondary | ICD-10-CM | POA: Diagnosis not present

## 2021-05-20 NOTE — Progress Notes (Signed)
This patient presents to the office for evaluation of his second toenail left foot.  He says he is diabetic with neuropathy.  He says he injured his nail and it blad.  He presents to the office with no pain or discomfort.  He is concerned about his nail since he is diabetic.   Vascular  Dorsalis pedis and posterior tibial pulses are palpable  B/L.  Capillary return  WNL.  Temperature gradient is  WNL.  Skin turgor  WNL  Sensorium  Senn Weinstein monofilament wire diminished/absent.. Normal tactile sensation.  Nail Exam  Patient has normal nails with no evidence of bacterial or fungal infection.  Elongated second toenail with subungual hematoma noted.  Orthopedic  Exam  Muscle tone and muscle strength  WNL.  No limitations of motion feet  B/L.  No crepitus or joint effusion noted.  Foot type is unremarkable and digits show no abnormalities.  Bony prominences are unremarkable.  Skin  No open lesions.  Normal skin texture and turgor.   Subungual hematoma second toe left foot.  Debride second toenail with nail nipper and dremel tool.  Diabetic foot exam reveals diminished/absent LOPS  B/L. RTC 1 year.  Helane Gunther DPM

## 2021-10-01 ENCOUNTER — Encounter: Payer: Self-pay | Admitting: Family Medicine

## 2021-10-01 ENCOUNTER — Ambulatory Visit: Payer: BC Managed Care – PPO | Admitting: Family Medicine

## 2021-10-01 ENCOUNTER — Other Ambulatory Visit: Payer: Self-pay

## 2021-10-01 DIAGNOSIS — E118 Type 2 diabetes mellitus with unspecified complications: Secondary | ICD-10-CM | POA: Diagnosis not present

## 2021-10-01 DIAGNOSIS — E039 Hypothyroidism, unspecified: Secondary | ICD-10-CM | POA: Diagnosis not present

## 2021-10-01 DIAGNOSIS — E782 Mixed hyperlipidemia: Secondary | ICD-10-CM

## 2021-10-01 DIAGNOSIS — E785 Hyperlipidemia, unspecified: Secondary | ICD-10-CM

## 2021-10-01 DIAGNOSIS — G629 Polyneuropathy, unspecified: Secondary | ICD-10-CM | POA: Diagnosis not present

## 2021-10-01 DIAGNOSIS — E1169 Type 2 diabetes mellitus with other specified complication: Secondary | ICD-10-CM

## 2021-10-01 DIAGNOSIS — E1142 Type 2 diabetes mellitus with diabetic polyneuropathy: Secondary | ICD-10-CM

## 2021-10-01 LAB — MICROALBUMIN / CREATININE URINE RATIO
Creatinine,U: 97.4 mg/dL
Microalb Creat Ratio: 0.7 mg/g (ref 0.0–30.0)
Microalb, Ur: <0.7 mg/dL (ref 0.0–1.9)

## 2021-10-01 LAB — POCT GLYCOSYLATED HEMOGLOBIN (HGB A1C)
HbA1c POC (<> result, manual entry): 7.8 % (ref 4.0–5.6)
HbA1c, POC (controlled diabetic range): 7.8 % — AB (ref 0.0–7.0)
HbA1c, POC (prediabetic range): 7.8 % — AB (ref 5.7–6.4)
Hemoglobin A1C: 7.8 % — AB (ref 4.0–5.6)

## 2021-10-01 MED ORDER — SITAGLIPTIN PHOSPHATE 100 MG PO TABS: 100.0000 mg | ORAL_TABLET | Freq: Every day | ORAL | 1 refills | Status: DC

## 2021-10-01 MED ORDER — ATORVASTATIN CALCIUM 20 MG PO TABS: 20.0000 mg | ORAL_TABLET | Freq: Every day | ORAL | 3 refills | Status: DC

## 2021-10-01 MED ORDER — OZEMPIC (1 MG/DOSE) 4 MG/3ML ~~LOC~~ SOPN
1.0000 mg | PEN_INJECTOR | SUBCUTANEOUS | 3 refills | Status: DC
Start: 2021-10-01 — End: 2022-07-02

## 2021-10-01 MED ORDER — OZEMPIC (0.25 OR 0.5 MG/DOSE) 2 MG/1.5ML ~~LOC~~ SOPN: PEN_INJECTOR | SUBCUTANEOUS | 1 refills | Status: AC

## 2021-10-01 MED ORDER — DAPAGLIFLOZIN PROPANEDIOL 10 MG PO TABS: 10.0000 mg | ORAL_TABLET | Freq: Every day | ORAL | 0 refills | Status: DC

## 2021-10-01 MED ORDER — METFORMIN HCL 1000 MG PO TABS: ORAL_TABLET | ORAL | 1 refills | Status: DC

## 2021-10-01 NOTE — Progress Notes (Signed)
Patient ID: Blake Davis, male   DOB: 1975/07/29, 46 y.o.   MRN: [id number]    Blake Davis , 1975-11-24, 46 y.o., male MRN: [id number]  Chief Complaint  Patient presents with   Diabetes    [facility]; pt is fasting   Patient Care Team    Relationship Specialty Notifications Start End  Ma Hillock, DO PCP - General Family Medicine  12/02/15   Janne Napoleon, PA-C (Inactive)  Dermatology  02/20/20     Subjective: Blake Davis is a 46 y.o. male present for [facility] Diabetes type 2 with retinopathy/HLD/obesity Pt reports compliance  with metformin 1000 mg BID, Amaryl 4/4 mg BID, farxiga 10 and  Januvia. Patient denies dizziness, hyperglycemic or hypoglycemic events. Patient denies worsening numbness, tingling in the extremities or nonhealing wounds of feet.  BG not routinely checked.  He has very mild chronic neuropathy.  He is prescribed statin and reports compliance   Hypothyroid: Compliance with levo 50 mcg - labs due next visit.   Lab Results  Component Value Date   HGBA1C 7.8 (A) 10/01/2021   HGBA1C 7.8 10/01/2021   HGBA1C 7.8 (A) 10/01/2021   HGBA1C 7.8 (A) 10/01/2021    Past Medical History:  Diagnosis Date   Cholestasis 12/2018   per ct for kidney stone   Diabetes mellitus without complication ([place])    Hepatic steatosis 12/2018   Extensive hepatic Seatosis per CT completed for kidney stone June 2020   Ingrowing nail, left great toe    Injury of right foot 09/22/2018   Nephrolithiasis 12/2018   2 mm nonobstructive stone   Pain in joint of left shoulder 09/10/2019   Pain in joint of right shoulder 09/10/2019   Visual changes 10/2015   Diabetic changes   Past Surgical History:  Procedure Laterality Date   FOOT SURGERY  1988   mole/nevus   NASAL SEPTUM SURGERY  1999   deviated   Family History  Problem Relation Age of Onset   Diabetes Mother    Heart disease Mother    Heart attack Mother    Alcohol abuse Paternal Uncle    Dementia Paternal  Grandmother    Diabetes Paternal Grandfather    Stroke Paternal Grandfather    Cancer Paternal Grandfather    Prostate cancer Paternal Grandfather    Allergies as of 10/01/2021   No Known Allergies      Medication List        Accurate as of October 01, 2021  8:45 AM. If you have any questions, ask your nurse or doctor.          atorvastatin 20 MG tablet Commonly known as: LIPITOR Take 1 tablet (20 mg total) by mouth daily.   dapagliflozin propanediol 10 MG Tabs tablet Commonly known as: Farxiga Take 1 tablet (10 mg total) by mouth daily.   Fish Oil 1000 MG Caps Take by mouth.   glimepiride 4 MG tablet Commonly known as: AMARYL Take 2 tablets (8 mg total) by mouth daily with breakfast.   levothyroxine 50 MCG tablet Commonly known as: SYNTHROID Take 1 tablet (50 mcg total) by mouth daily before breakfast.   metFORMIN 1000 MG tablet Commonly known as: GLUCOPHAGE 1000 mg BID with food   MULTI COMPLETE PO Take by mouth.   onetouch ultrasoft lancets   OneTouch Verio test strip Generic drug: glucose blood   Ozempic (0.25 or 0.5 MG/DOSE) 2 MG/1.5ML Sopn Generic drug: Semaglutide(0.25 or 0.5MG /DOS) Inject 0.25 mg into the skin once a  week for 28 days, THEN 0.5 mg once a week for 28 days. Start taking on: October 01, 2021 Started by: Howard Pouch, DO   Ozempic (1 MG/DOSE) 4 MG/3ML Sopn Generic drug: Semaglutide (1 MG/DOSE) Inject 1 mg into the skin once a week. Started by: Howard Pouch, DO   pimecrolimus 1 % cream Commonly known as: Elidel Apply topically 2 (two) times daily.   sitaGLIPtin 100 MG tablet Commonly known as: JANUVIA Take 1 tablet (100 mg total) by mouth daily.        ROS: Negative, with the exception of above mentioned in HPI  Objective:  BP 121/73    Pulse 76    Temp 98.4 F (36.9 C) (Oral)    Ht 5\' 11"  (1.803 m)    Wt 248 lb (112.5 kg)    SpO2 97%    BMI 34.59 kg/m  Body mass index is 34.59 kg/m. Physical Exam Vitals and nursing note  reviewed.  Constitutional:      General: He is not in acute distress.    Appearance: Normal appearance. He is obese. He is not ill-appearing, toxic-appearing or diaphoretic.  HENT:     Head: Normocephalic and atraumatic.  Eyes:     General: No scleral icterus.       Right eye: No discharge.        Left eye: No discharge.     Extraocular Movements: Extraocular movements intact.     Pupils: Pupils are equal, round, and reactive to light.  Cardiovascular:     Rate and Rhythm: Normal rate and regular rhythm.  Pulmonary:     Effort: Pulmonary effort is normal. No respiratory distress.     Breath sounds: Normal breath sounds. No wheezing, rhonchi or rales.  Musculoskeletal:     Cervical back: Neck supple.     Right lower leg: No edema.     Left lower leg: No edema.  Lymphadenopathy:     Cervical: No cervical adenopathy.  Skin:    General: Skin is warm and dry.     Coloration: Skin is not jaundiced or pale.     Findings: No rash.  Neurological:     Mental Status: He is alert and oriented to person, place, and time. Mental status is at baseline.  Psychiatric:        Mood and Affect: Mood normal.        Behavior: Behavior normal.        Thought Content: Thought content normal.        Judgment: Judgment normal.   Diabetic Foot Exam - Simple   No data filed      Results for orders placed or performed in visit on 10/01/21 (from the past 24 hour(s))  POCT HgB A1C     Status: Abnormal   Collection Time: 10/01/21  8:25 AM  Result Value Ref Range   Hemoglobin A1C 7.8 (A) 4.0 - 5.6 %   HbA1c POC (<> result, manual entry) 7.8 4.0 - 5.6 %   HbA1c, POC (prediabetic range) 7.8 (A) 5.7 - 6.4 %   HbA1c, POC (controlled diabetic range) 7.8 (A) 0.0 - 7.0 %     Assessment/Plan: Blake Davis is a 46 y.o. 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  -continue  metformin 1000 mg BID.  -continue januvia 100 QD -continue amaryl to 8  mg total daily- may take as BID if desired.  -cotninue  farxiga 10 mg  QD (for next 2-3 months while tapering up on ozempic) - start ozempic taper 0.25>0.5>1 mg - increasing dose after every 4 th dose  -Continue statin  - neuropathy stable> pt has declined gabapentin  PNA series:  completed. 04/04/2017 Flu shot: Completed  (recommneded yearly) Microalb: completed today Foot exam: UTD  Eye exam:  10/21/2020 Heisler, OD> DUE A1c: 7.3--> 6.4---> 7.6--> 7.9--> 7.9--> 8.5--> 10.3--> 6.8--> 7.6 >> 8.7> 6.8>7.1> 7.1>6.7> 7.0 >6.3 >7.2 > 7.8 today !!  Morbid obesity (HCC)/Hyperlipidemia Stable Continue statin Diet and exercise modifications encouraged> they are going to start water exercises soon.   Hypothyroid: Stable.  Continue with levothyroxine 50 mcg.   Labs due next visit.   Return in 24 weeks (on 03/18/2022) for CPE, [facility] (30 min).   Orders Placed This Encounter  Procedures   Urine Microalbumin w/creat. ratio   POCT HgB A1C   Meds ordered this encounter  Medications   atorvastatin (LIPITOR) 20 MG tablet    Sig: Take 1 tablet (20 mg total) by mouth daily.    Dispense:  90 tablet    Refill:  3   Semaglutide,0.25 or 0.5MG /DOS, (OZEMPIC, 0.25 OR 0.5 MG/DOSE,) 2 MG/1.5ML SOPN    Sig: Inject 0.25 mg into the skin once a week for 28 days, THEN 0.5 mg once a week for 28 days.    Dispense:  1.5 mL    Refill:  1    Script 1 and 2:3 tapering.   dapagliflozin propanediol (FARXIGA) 10 MG TABS tablet    Sig: Take 1 tablet (10 mg total) by mouth daily.    Dispense:  90 tablet    Refill:  0   metFORMIN (GLUCOPHAGE) 1000 MG tablet    Sig: 1000 mg BID with food    Dispense:  180 tablet    Refill:  1   sitaGLIPtin (JANUVIA) 100 MG tablet    Sig: Take 1 tablet (100 mg total) by mouth daily.    Dispense:  90 tablet    Refill:  1   Semaglutide, 1 MG/DOSE,  (OZEMPIC, 1 MG/DOSE,) 4 MG/3ML SOPN    Sig: Inject 1 mg into the skin once a week.    Dispense:  9 mL    Refill:  3    Script 3:3 tapering. Do not fill until lower doses of tapering are complete     Referral Orders  No referral(s) requested today     electronically signed by:  Howard Pouch, DO  [place]

## 2021-10-01 NOTE — Patient Instructions (Addendum)
?  Your a1c continue to increase--- now 7.8. ?Farxiga for 2 more months, then may discontinue once ozempic is at 1mg  weekly dose.  ? ?Stay on all other meds.  ?Have fun in .  ?

## 2021-10-15 ENCOUNTER — Encounter: Payer: Self-pay | Admitting: Family Medicine

## 2022-03-18 ENCOUNTER — Ambulatory Visit (INDEPENDENT_AMBULATORY_CARE_PROVIDER_SITE_OTHER): Payer: BC Managed Care – PPO | Admitting: Family Medicine

## 2022-03-18 ENCOUNTER — Encounter: Payer: Self-pay | Admitting: Family Medicine

## 2022-03-18 VITALS — BP 114/67 | HR 71 | Temp 98.6°F | Ht 71.0 in | Wt 236.0 lb

## 2022-03-18 DIAGNOSIS — E1169 Type 2 diabetes mellitus with other specified complication: Secondary | ICD-10-CM

## 2022-03-18 DIAGNOSIS — Z125 Encounter for screening for malignant neoplasm of prostate: Secondary | ICD-10-CM

## 2022-03-18 DIAGNOSIS — E782 Mixed hyperlipidemia: Secondary | ICD-10-CM

## 2022-03-18 DIAGNOSIS — E039 Hypothyroidism, unspecified: Secondary | ICD-10-CM | POA: Diagnosis not present

## 2022-03-18 DIAGNOSIS — Z0000 Encounter for general adult medical examination without abnormal findings: Secondary | ICD-10-CM | POA: Diagnosis not present

## 2022-03-18 DIAGNOSIS — E118 Type 2 diabetes mellitus with unspecified complications: Secondary | ICD-10-CM | POA: Diagnosis not present

## 2022-03-18 DIAGNOSIS — E1142 Type 2 diabetes mellitus with diabetic polyneuropathy: Secondary | ICD-10-CM | POA: Diagnosis not present

## 2022-03-18 DIAGNOSIS — Z1211 Encounter for screening for malignant neoplasm of colon: Secondary | ICD-10-CM

## 2022-03-18 DIAGNOSIS — Z23 Encounter for immunization: Secondary | ICD-10-CM

## 2022-03-18 DIAGNOSIS — Z1159 Encounter for screening for other viral diseases: Secondary | ICD-10-CM

## 2022-03-18 DIAGNOSIS — Z114 Encounter for screening for human immunodeficiency virus [HIV]: Secondary | ICD-10-CM | POA: Diagnosis not present

## 2022-03-18 DIAGNOSIS — E785 Hyperlipidemia, unspecified: Secondary | ICD-10-CM

## 2022-03-18 DIAGNOSIS — Z8042 Family history of malignant neoplasm of prostate: Secondary | ICD-10-CM

## 2022-03-18 MED ORDER — SITAGLIPTIN PHOSPHATE 100 MG PO TABS: 100.0000 mg | ORAL_TABLET | Freq: Every day | ORAL | 1 refills | Status: DC

## 2022-03-18 MED ORDER — METFORMIN HCL 1000 MG PO TABS: ORAL_TABLET | ORAL | 1 refills | Status: DC

## 2022-03-18 NOTE — Progress Notes (Signed)
Patient ID: Blake Davis, male  DOB: 1976-03-09, 46 y.o.   MRN: [id number] Patient Care Team    Relationship Specialty Notifications Start End  Natalia Leatherwood, DO PCP - General Family Medicine  12/02/15   Clark-Burning, Gertie Baron (Inactive)  Dermatology  02/20/20     Chief Complaint  Patient presents with   Annual Exam    Pt is fasting    Subjective: Blake Davis is a 46 y.o. male present for CPE/cmc combination appointment All past medical history, surgical history, allergies, family history, immunizations, medications and social history were updated in the electronic medical record today. All recent labs, ED visits and hospitalizations within the last year were reviewed.  Health maintenance:  Colonoscopy:agreeable to cologuard . Immunizations:  tdap UTD 2020, influenza completed today, PNA series completed Infectious disease screening: HIV and Hep C collected today. If not completed prior, screening test offered. PSA: No results found for: "PSA", pt was counseled on prostate cancer screenings. History of prostate cancer in paternal grandfather.  PSA collected today Assistive device: None Oxygen use: None Patient has a Dental home. Hospitalizations/ED visits: Reviewed  Diabetes type 2 with retinopathy/HLD/obesity Pt reports compliance with metformin 1000 mg BID, Amaryl 4/4 mg BID, farxiga 10 and  Januvia. Patient denies dizziness, hyperglycemic or hypoglycemic events. Patient denies numbness, tingling in the extremities or nonhealing wounds of feet.  BG not routinely checked.  He has very mild chronic neuropathy.  He is prescribed statin and reports compliance   Hypothyroid: Compliance with levo 50 mcg      10/01/2021    8:16 AM 06/11/2020    8:21 AM 11/28/2019    8:15 AM 12/29/2018    8:14 AM 05/22/2018    8:33 AM  Depression screen PHQ 2/9  Decreased Interest 0 0 0 0 0  Down, Depressed, Hopeless 0 0 0 0 0  PHQ - 2 Score 0 0 0 0 0       No data to  display                  12/29/2018    8:14 AM 05/22/2018    8:33 AM 04/04/2017    8:04 AM  Fall Risk   Falls in the past year? 0 No No  Follow up Falls evaluation completed        Immunization History  Administered Date(s) Administered   Influenza Inj Mdck Quad Pf 05/20/2018   Influenza Split 04/26/2015   Influenza,inj,Quad PF,6+ Mos 05/30/2017, 08/03/2019, 04/17/2021, 03/18/2022   Influenza,inj,quad, With Preservative 08/05/2019   Influenza-Unspecified 05/31/2016, 04/24/2020   Moderna SARS-COV2 Booster Vaccination 04/04/2021   Moderna Sars-Covid-2 Vaccination 08/06/2019, 09/03/2019, 05/23/2020   Pneumococcal Conjugate-13 03/03/2016   Pneumococcal Polysaccharide-23 04/04/2017   Td 07/27/1999   Tdap 08/04/2017, 09/19/2018     Past Medical History:  Diagnosis Date   Cholestasis 12/2018   per ct for kidney stone   Diabetes mellitus without complication (HCC)    Hepatic steatosis 12/2018   Extensive hepatic Seatosis per CT completed for kidney stone June 2020   Ingrowing nail, left great toe    Injury of right foot 09/22/2018   Nephrolithiasis 12/2018   2 mm nonobstructive stone   Pain in joint of left shoulder 09/10/2019   Pain in joint of right shoulder 09/10/2019   Visual changes 10/2015   Diabetic changes   No Known Allergies Past Surgical History:  Procedure Laterality Date   FOOT SURGERY  1988   mole/nevus  NASAL SEPTUM SURGERY  1999   deviated   Family History  Problem Relation Age of Onset   Diabetes Mother    Heart disease Mother    Heart attack Mother    Alcohol abuse Paternal Uncle    Dementia Paternal Grandmother    Diabetes Paternal Grandfather    Stroke Paternal Grandfather    Cancer Paternal Grandfather    Prostate cancer Paternal Grandfather    Social History   Social History Narrative   Patient lives at home with wife (Blake Davis). No children.   Has two pets.    Bachelors degree, works as a Visual merchandiser   Drinks caffeinated  beverages   Wears his seatbelt   Smoke detector in home   Feels safe in his relationships    Allergies as of 03/18/2022   No Known Allergies      Medication List        Accurate as of March 18, 2022  9:11 AM. If you have any questions, ask your nurse or doctor.          atorvastatin 20 MG tablet Commonly known as: LIPITOR Take 1 tablet (20 mg total) by mouth daily.   dapagliflozin propanediol 10 MG Tabs tablet Commonly known as: Farxiga Take 1 tablet (10 mg total) by mouth daily.   Fish Oil 1000 MG Caps Take by mouth.   glimepiride 4 MG tablet Commonly known as: AMARYL Take 2 tablets (8 mg total) by mouth daily with breakfast.   levothyroxine 50 MCG tablet Commonly known as: SYNTHROID Take 1 tablet (50 mcg total) by mouth daily before breakfast.   metFORMIN 1000 MG tablet Commonly known as: GLUCOPHAGE 1000 mg BID with food   MULTI COMPLETE PO Take by mouth.   onetouch ultrasoft lancets   OneTouch Verio test strip Generic drug: glucose blood   Ozempic (1 MG/DOSE) 4 MG/3ML Sopn Generic drug: Semaglutide (1 MG/DOSE) Inject 1 mg into the skin once a week.   pimecrolimus 1 % cream Commonly known as: Elidel Apply topically 2 (two) times daily.   sitaGLIPtin 100 MG tablet Commonly known as: JANUVIA Take 1 tablet (100 mg total) by mouth daily.       All past medical history, surgical history, allergies, family history, immunizations andmedications were updated in the EMR today and reviewed under the history and medication portions of their EMR.     No results found for this or any previous visit (from the past 2160 hour(s)).  No results found.  ROS 14 pt review of systems performed and negative (unless mentioned in an HPI)  Objective:  BP 114/67   Pulse 71   Temp 98.6 F (37 C) (Oral)   Ht 5\' 11"  (1.803 m)   Wt 236 lb (107 kg)   SpO2 99%   BMI 32.92 kg/m  Physical Exam Constitutional:      General: He is not in acute distress.     Appearance: Normal appearance. He is obese. He is not ill-appearing, toxic-appearing or diaphoretic.  HENT:     Head: Normocephalic and atraumatic.     Right Ear: Tympanic membrane, ear canal and external ear normal. There is no impacted cerumen.     Left Ear: Tympanic membrane, ear canal and external ear normal. There is no impacted cerumen.     Nose: Nose normal. No congestion or rhinorrhea.     Mouth/Throat:     Mouth: Mucous membranes are moist.     Pharynx: Oropharynx is clear. No oropharyngeal exudate  or posterior oropharyngeal erythema.  Eyes:     General: No scleral icterus.       Right eye: No discharge.        Left eye: No discharge.     Extraocular Movements: Extraocular movements intact.     Pupils: Pupils are equal, round, and reactive to light.  Cardiovascular:     Rate and Rhythm: Normal rate and regular rhythm.     Pulses: Normal pulses.     Heart sounds: Normal heart sounds. No murmur heard.    No friction rub. No gallop.  Pulmonary:     Effort: Pulmonary effort is normal. No respiratory distress.     Breath sounds: Normal breath sounds. No stridor. No wheezing, rhonchi or rales.  Chest:     Chest wall: No tenderness.  Abdominal:     General: Abdomen is flat. Bowel sounds are normal. There is no distension.     Palpations: Abdomen is soft. There is no mass.     Tenderness: There is no abdominal tenderness. There is no right CVA tenderness, left CVA tenderness, guarding or rebound.     Hernia: No hernia is present.  Musculoskeletal:        General: No swelling or tenderness. Normal range of motion.     Cervical back: Normal range of motion and neck supple.     Right lower leg: No edema.     Left lower leg: No edema.  Lymphadenopathy:     Cervical: No cervical adenopathy.  Skin:    General: Skin is warm and dry.     Coloration: Skin is not jaundiced.     Findings: No bruising, lesion or rash.  Neurological:     General: No focal deficit present.     Mental  Status: He is alert and oriented to person, place, and time. Mental status is at baseline.     Cranial Nerves: No cranial nerve deficit.     Sensory: No sensory deficit.     Motor: No weakness.     Coordination: Coordination normal.     Gait: Gait normal.     Deep Tendon Reflexes: Reflexes normal.  Psychiatric:        Mood and Affect: Mood normal.        Behavior: Behavior normal.        Thought Content: Thought content normal.        Judgment: Judgment normal.     No results found.  Assessment/plan: ALFONSA VAILE is a 46 y.o. male present for CPE/CMC Type 2 diabetes mellitus with complication, without long-term current use of insulin (HCC) Other diabetic neurological complication associated with type 2 diabetes mellitus (HCC) A1c has been trending up the last few visits. Continue metformin 1000 mg BID.  Continue januvia 100 QD Continue amaryl to 8 mg total daily- may take as BID if desired.  -Patient never started Ozempic last visit.  Is willing to start tapering now if A1c warrants.  We will wait on the results of his A1c today to either continue the farxiga 10 mg  QD or Ozempic -May reconsider starting ozempic taper 0.25>0.5>1 mg - increasing dose after every 4 th dose  -Continue statin  - neuropathy stable> pt has declined gabapentin  PNA series:  completed. 04/04/2017 Flu shot: completed today (recommneded yearly) Microalb: Up-to-date Foot exam: Completed 03/18/2022 Eye exam:  10/21/2020 Daisey Must, OD> DUE! A1c: 7.3--> 6.4---> 7.6--> 7.9--> 7.9--> 8.5--> 10.3--> 6.8--> 7.6 >> 8.7> 6.8>7.1> 7.1>6.7> 7.0 >6.3 >7.2 > 7.8 >  collected today  Morbid obesity (HCC)/Hyperlipidemia Stable continue statin Diet and exercise modifications encouraged> they are going to start water exercises soon.   Hypothyroid: Has been stable Continue with levothyroxine 50 mcg.  TSH collected today.  Once results received we will call in refills of levothyroxine at appropriate dose.  Encounter for  hepatitis C screening test for low risk patient - Hepatitis C Antibody Encounter for screening for HIV - HIV antibody (with reflex) Need for immunization against influenza - Flu Vaccine QUAD 6+ mos PF IM (Fluarix Quad PF) Prostate cancer screening/Family history of prostate cancer - PSA Colon cancer screening - Cologuard; Future Routine general medical examination at a health care facility Colonoscopy:agreeable to cologuard . Immunizations:  tdap UTD 2020, influenza completed today, PNA series completed Infectious disease screening: HIV and Hep C collected today. If not completed prior, screening test offered. PSA: No results found for: "PSA", pt was counseled on prostate cancer screenings. History of prostate cancer in paternal grandfather.  PSA collected today Patient was encouraged to exercise greater than 150 minutes a week. Patient was encouraged to choose a diet filled with fresh fruits and vegetables, and lean meats. AVS provided to patient today for education/recommendation on gender specific health and safety maintenance.  Return in about 15 weeks (around 07/01/2022) for Routine chronic condition follow-up.  Orders Placed This Encounter  Procedures   Flu Vaccine QUAD 6+ mos PF IM (Fluarix Quad PF)   CBC   Comprehensive metabolic panel   Hemoglobin A1c   Lipid panel   PSA   TSH   Cologuard   Hepatitis C Antibody   HIV antibody (with reflex)   Meds ordered this encounter  Medications   metFORMIN (GLUCOPHAGE) 1000 MG tablet    Sig: 1000 mg BID with food    Dispense:  180 tablet    Refill:  1   sitaGLIPtin (JANUVIA) 100 MG tablet    Sig: Take 1 tablet (100 mg total) by mouth daily.    Dispense:  90 tablet    Refill:  1   Referral Orders  No referral(s) requested today     Note is dictated utilizing voice recognition software. Although note has been proof read prior to signing, occasional typographical errors still can be missed. If any questions arise, please do  not hesitate to call for verification.  Electronically signed by: Felix Pacini, DO [facility] [place]

## 2022-03-18 NOTE — Patient Instructions (Addendum)
Return in about 15 weeks (around 07/01/2022) for Routine chronic condition follow-up.        Great to see you today.  I have refilled the medication(s) we provide.   If labs were collected, we will inform you of lab results once received either by echart message or telephone call.   - echart message- for normal results that have been seen by the patient already.   - telephone call: abnormal results or if patient has not viewed results in their echart.  Health Maintenance, Male Adopting a healthy lifestyle and getting preventive care are important in promoting health and wellness. Ask your health care provider about: The right schedule for you to have regular tests and exams. Things you can do on your own to prevent diseases and keep yourself healthy. What should I know about diet, weight, and exercise? Eat a healthy diet  Eat a diet that includes plenty of vegetables, fruits, low-fat dairy products, and lean protein. Do not eat a lot of foods that are high in solid fats, added sugars, or sodium. Maintain a healthy weight Body mass index (BMI) is a measurement that can be used to identify possible weight problems. It estimates body fat based on height and weight. Your health care provider can help determine your BMI and help you achieve or maintain a healthy weight. Get regular exercise Get regular exercise. This is one of the most important things you can do for your health. Most adults should: Exercise for at least 150 minutes each week. The exercise should increase your heart rate and make you sweat (moderate-intensity exercise). Do strengthening exercises at least twice a week. This is in addition to the moderate-intensity exercise. Spend less time sitting. Even light physical activity can be beneficial. Watch cholesterol and blood lipids Have your blood tested for lipids and cholesterol at 48 years of age, then have this test every 5 years. You may need to have your cholesterol  levels checked more often if: Your lipid or cholesterol levels are high. You are older than 48 years of age. You are at high risk for heart disease. What should I know about cancer screening? Many types of cancers can be detected early and may often be prevented. Depending on your health history and family history, you may need to have cancer screening at various ages. This may include screening for: Colorectal cancer. Prostate cancer. Skin cancer. Lung cancer. What should I know about heart disease, diabetes, and high blood pressure? Blood pressure and heart disease High blood pressure causes heart disease and increases the risk of stroke. This is more likely to develop in people who have high blood pressure readings or are overweight. Talk with your health care provider about your target blood pressure readings. Have your blood pressure checked: Every 3-5 years if you are 63-80 years of age. Every year if you are 49 years old or older. If you are between the ages of 33 and 46 and are a current or former smoker, ask your health care provider if you should have a one-time screening for abdominal aortic aneurysm (AAA). Diabetes Have regular diabetes screenings. This checks your fasting blood sugar level. Have the screening done: Once every three years after age 19 if you are at a normal weight and have a low risk for diabetes. More often and at a younger age if you are overweight or have a high risk for diabetes. What should I know about preventing infection? Hepatitis B If you have a higher risk  for hepatitis B, you should be screened for this virus. Talk with your health care provider to find out if you are at risk for hepatitis B infection. Hepatitis C Blood testing is recommended for: Everyone born from [birth year] through 1967. Anyone with known risk factors for hepatitis C. Sexually transmitted infections (STIs) You should be screened each year for STIs, including gonorrhea and chlamydia,  if: You are sexually active and are younger than 48 years of age. You are older than 48 years of age and your health care provider tells you that you are at risk for this type of infection. Your sexual activity has changed since you were last screened, and you are at increased risk for chlamydia or gonorrhea. Ask your health care provider if you are at risk. Ask your health care provider about whether you are at high risk for HIV. Your health care provider may recommend a prescription medicine to help prevent HIV infection. If you choose to take medicine to prevent HIV, you should first get tested for HIV. You should then be tested every 3 months for as long as you are taking the medicine. Follow these instructions at home: Alcohol use Do not drink alcohol if your health care provider tells you not to drink. If you drink alcohol: Limit how much you have to 0-2 drinks a day. Know how much alcohol is in your drink. In the [place] one drink equals one 12 oz bottle of beer (355 mL), one 5 oz glass of wine (148 mL), or one 1 oz glass of hard liquor (44 mL). Lifestyle Do not use any products that contain nicotine or tobacco. These products include cigarettes, chewing tobacco, and vaping devices, such as e-cigarettes. If you need help quitting, ask your health care provider. Do not use street drugs. Do not share needles. Ask your health care provider for help if you need support or information about quitting drugs. General instructions Schedule regular health, dental, and eye exams. Stay current with your vaccines. Tell your health care provider if: You often feel depressed. You have ever been abused or do not feel safe at home. Summary Adopting a healthy lifestyle and getting preventive care are important in promoting health and wellness. Follow your health care provider's instructions about healthy diet, exercising, and getting tested or screened for diseases. Follow your health care provider's  instructions on monitoring your cholesterol and blood pressure. This information is not intended to replace advice given to you by your health care provider. Make sure you discuss any questions you have with your health care provider. Document Revised: 12/01/2020 Document Reviewed: 12/01/2020 Elsevier Patient Education  [place].

## 2022-03-22 ENCOUNTER — Other Ambulatory Visit (INDEPENDENT_AMBULATORY_CARE_PROVIDER_SITE_OTHER): Payer: BC Managed Care – PPO

## 2022-03-22 DIAGNOSIS — Z1159 Encounter for screening for other viral diseases: Secondary | ICD-10-CM | POA: Diagnosis not present

## 2022-03-22 DIAGNOSIS — Z125 Encounter for screening for malignant neoplasm of prostate: Secondary | ICD-10-CM | POA: Diagnosis not present

## 2022-03-22 DIAGNOSIS — E782 Mixed hyperlipidemia: Secondary | ICD-10-CM | POA: Diagnosis not present

## 2022-03-22 DIAGNOSIS — Z114 Encounter for screening for human immunodeficiency virus [HIV]: Secondary | ICD-10-CM

## 2022-03-22 DIAGNOSIS — E039 Hypothyroidism, unspecified: Secondary | ICD-10-CM

## 2022-03-22 DIAGNOSIS — E118 Type 2 diabetes mellitus with unspecified complications: Secondary | ICD-10-CM

## 2022-03-22 DIAGNOSIS — Z8042 Family history of malignant neoplasm of prostate: Secondary | ICD-10-CM

## 2022-03-22 LAB — CBC
HCT: 48.3 % (ref 39.0–52.0)
Hemoglobin: 16.1 g/dL (ref 13.0–17.0)
MCHC: 33.4 g/dL (ref 30.0–36.0)
MCV: 88.6 fl (ref 78.0–100.0)
Platelets: 153 10*3/uL (ref 150.0–400.0)
RBC: 5.46 Mil/uL (ref 4.22–5.81)
RDW: 13.2 % (ref 11.5–15.5)
WBC: 5.8 10*3/uL (ref 4.0–10.5)

## 2022-03-22 LAB — COMPREHENSIVE METABOLIC PANEL
ALT: 47 U/L (ref 0–53)
AST: 21 U/L (ref 0–37)
Albumin: 4.5 g/dL (ref 3.5–5.2)
Alkaline Phosphatase: 55 U/L (ref 39–117)
BUN: 17 mg/dL (ref 6–23)
CO2: 24 mEq/L (ref 19–32)
Calcium: 9.3 mg/dL (ref 8.4–10.5)
Chloride: 96 mEq/L (ref 96–112)
Creatinine, Ser: 1.14 mg/dL (ref 0.40–1.50)
GFR: 76.3 mL/min (ref >60.00)
Glucose, Bld: 410 mg/dL — ABNORMAL HIGH (ref 70–99)
Potassium: 4.4 mEq/L (ref 3.5–5.1)
Sodium: 135 mEq/L (ref 135–145)
Total Bilirubin: 0.7 mg/dL (ref 0.2–1.2)
Total Protein: 7.4 g/dL (ref 6.0–8.3)

## 2022-03-22 LAB — LIPID PANEL
Cholesterol: 212 mg/dL — ABNORMAL HIGH (ref 0–200)
HDL: 31.6 mg/dL — ABNORMAL LOW (ref >39.00)
Total CHOL/HDL Ratio: 7
Triglycerides: 441 mg/dL — ABNORMAL HIGH (ref 0.0–149.0)

## 2022-03-22 LAB — PSA: PSA: 0.25 ng/mL (ref 0.10–4.00)

## 2022-03-22 LAB — HEMOGLOBIN A1C: Hgb A1c MFr Bld: 9.4 % — ABNORMAL HIGH (ref 4.6–6.5)

## 2022-03-22 LAB — LDL CHOLESTEROL, DIRECT: Direct LDL: 118 mg/dL

## 2022-03-22 LAB — TSH: TSH: 3.85 u[IU]/mL (ref 0.35–5.50)

## 2022-03-22 NOTE — Addendum Note (Signed)
Addended by: Maxie Barb on: 03/22/2022 08:04 AM   Modules accepted: Orders

## 2022-03-23 ENCOUNTER — Telehealth: Payer: Self-pay | Admitting: Family Medicine

## 2022-03-23 DIAGNOSIS — E1169 Type 2 diabetes mellitus with other specified complication: Secondary | ICD-10-CM

## 2022-03-23 DIAGNOSIS — E782 Mixed hyperlipidemia: Secondary | ICD-10-CM

## 2022-03-23 LAB — HEPATITIS C ANTIBODY: Hepatitis C Ab: NONREACTIVE

## 2022-03-23 LAB — HIV ANTIBODY (ROUTINE TESTING W REFLEX): HIV 1&2 Ab, 4th Generation: NONREACTIVE

## 2022-03-23 MED ORDER — ATORVASTATIN CALCIUM 20 MG PO TABS: 20.0000 mg | ORAL_TABLET | Freq: Every day | ORAL | 3 refills | Status: DC

## 2022-03-23 MED ORDER — ATORVASTATIN CALCIUM 40 MG PO TABS: 40.0000 mg | ORAL_TABLET | Freq: Every day | ORAL | 3 refills | Status: DC

## 2022-03-23 MED ORDER — METFORMIN HCL 1000 MG PO TABS: ORAL_TABLET | ORAL | 1 refills | Status: DC

## 2022-03-23 MED ORDER — GLIMEPIRIDE 4 MG PO TABS: 8.0000 mg | ORAL_TABLET | Freq: Every day | ORAL | 1 refills | Status: DC

## 2022-03-23 MED ORDER — SITAGLIPTIN PHOSPHATE 100 MG PO TABS: 100.0000 mg | ORAL_TABLET | Freq: Every day | ORAL | 1 refills | Status: DC

## 2022-03-23 MED ORDER — LEVOTHYROXINE SODIUM 50 MCG PO TABS
50.0000 ug | ORAL_TABLET | Freq: Every day | ORAL | 3 refills | Status: DC
Start: 2022-03-23 — End: 2023-03-24

## 2022-03-23 NOTE — Telephone Encounter (Signed)
LVM for pt to CB regarding results.  

## 2022-03-23 NOTE — Telephone Encounter (Signed)
Please call patient Blake Davis, Blake Davis, Blake Davis and check with them to make sure they still have the first initial starter dose available for him.

## 2022-03-24 NOTE — Telephone Encounter (Signed)
LVM for pt to CB regarding results.  

## 2022-03-25 NOTE — Telephone Encounter (Signed)
Spoke with pt regarding labs and instructions.   

## 2022-05-21 ENCOUNTER — Ambulatory Visit: Payer: BC Managed Care – PPO | Admitting: Podiatry

## 2022-05-21 ENCOUNTER — Encounter: Payer: Self-pay | Admitting: Podiatry

## 2022-05-21 DIAGNOSIS — E1142 Type 2 diabetes mellitus with diabetic polyneuropathy: Secondary | ICD-10-CM | POA: Diagnosis not present

## 2022-05-21 DIAGNOSIS — B351 Tinea unguium: Secondary | ICD-10-CM | POA: Diagnosis not present

## 2022-05-21 NOTE — Progress Notes (Signed)
This patient presents to the office for evaluation of his second toenail left foot.  He says he is diabetic with neuropathy.   He presents to the office with no pain or discomfort.  He is concerned about his nail since he is diabetic.   Vascular  Dorsalis pedis and posterior tibial pulses are palpable  B/L.  Capillary return  WNL.  Temperature gradient is  WNL.  Skin turgor  WNL  Sensorium  Senn Weinstein monofilament wire diminished/absent.. Normal tactile sensation.  Nail Exam  Patient has normal nails with no evidence of bacterial or fungal infection.  Elongated second toenail with subungual hematoma noted.  Orthopedic  Exam  Muscle tone and muscle strength  WNL.  No limitations of motion feet  B/L.  No crepitus or joint effusion noted.  Foot type is unremarkable and digits show no abnormalities.  Bony prominences are unremarkable.  Hammer toe second right.  Skin  No open lesions.  Normal skin texture and turgor.  Asymptomatic callus sub 1st MPJ  B/L.  Subungual hematoma second toe left foot.  Debride second toenail with nail nipper and dremel tool.  Diabetic foot exam reveals diminished/absent LOPS  B/L. RTC 1 year.  Gardiner Barefoot DPM

## 2022-06-01 LAB — HM DIABETES EYE EXAM

## 2022-07-01 ENCOUNTER — Ambulatory Visit: Payer: BC Managed Care – PPO | Admitting: Family Medicine

## 2022-07-02 ENCOUNTER — Ambulatory Visit: Payer: BC Managed Care – PPO | Admitting: Family Medicine

## 2022-07-02 ENCOUNTER — Encounter: Payer: Self-pay | Admitting: Family Medicine

## 2022-07-02 ENCOUNTER — Other Ambulatory Visit: Payer: Self-pay

## 2022-07-02 VITALS — BP 115/71 | HR 78 | Temp 98.4°F | Ht 71.0 in | Wt 245.0 lb

## 2022-07-02 DIAGNOSIS — E1169 Type 2 diabetes mellitus with other specified complication: Secondary | ICD-10-CM

## 2022-07-02 DIAGNOSIS — E782 Mixed hyperlipidemia: Secondary | ICD-10-CM

## 2022-07-02 DIAGNOSIS — E785 Hyperlipidemia, unspecified: Secondary | ICD-10-CM

## 2022-07-02 DIAGNOSIS — E118 Type 2 diabetes mellitus with unspecified complications: Secondary | ICD-10-CM | POA: Diagnosis not present

## 2022-07-02 LAB — POCT GLYCOSYLATED HEMOGLOBIN (HGB A1C)
HbA1c POC (<> result, manual entry): 9.5 % (ref 4.0–5.6)
HbA1c, POC (controlled diabetic range): 9.5 % — AB (ref 0.0–7.0)
HbA1c, POC (prediabetic range): 9.5 % — AB (ref 5.7–6.4)
Hemoglobin A1C: 9.5 % — AB (ref 4.0–5.6)

## 2022-07-02 MED ORDER — METFORMIN HCL 1000 MG PO TABS: ORAL_TABLET | ORAL | 1 refills | Status: DC

## 2022-07-02 MED ORDER — DAPAGLIFLOZIN PROPANEDIOL 10 MG PO TABS: 10.0000 mg | ORAL_TABLET | Freq: Every day | ORAL | 1 refills | Status: DC

## 2022-07-02 MED ORDER — ATORVASTATIN CALCIUM 40 MG PO TABS: 40.0000 mg | ORAL_TABLET | Freq: Every day | ORAL | 3 refills | Status: DC

## 2022-07-02 MED ORDER — GLIMEPIRIDE 4 MG PO TABS: 8.0000 mg | ORAL_TABLET | Freq: Every day | ORAL | 1 refills | Status: DC

## 2022-07-02 MED ORDER — SITAGLIPTIN PHOSPHATE 100 MG PO TABS: 100.0000 mg | ORAL_TABLET | Freq: Every day | ORAL | 1 refills | Status: DC

## 2022-07-02 NOTE — Patient Instructions (Signed)
Return in about 24 weeks (around 12/17/2022) for cpe (20 min).        Great to see you today.  I have refilled the medication(s) we provide.   If labs were collected, we will inform you of lab results once received either by echart message or telephone call.   - echart message- for normal results that have been seen by the patient already.   - telephone call: abnormal results or if patient has not viewed results in their echart.

## 2022-07-02 NOTE — Progress Notes (Deleted)
Blake Davis , 1976/05/07, 46 y.o., male MRN: [id number] Patient Care Team    Relationship Specialty Notifications Start End  Blake Leatherwood, DO PCP - General Family Medicine  12/02/15   Blake Davis, Blake Dike, PA-C (Inactive)  Dermatology  02/20/20     No chief complaint on file.    Subjective: Pt presents for an OV with complaints of *** of *** duration.  Associated symptoms include ***.  Pt has tried *** to ease their symptoms.      10/01/2021    8:16 AM 06/11/2020    8:21 AM 11/28/2019    8:15 AM 12/29/2018    8:14 AM 05/22/2018    8:33 AM  Depression screen PHQ 2/9  Decreased Interest 0 0 0 0 0  Down, Depressed, Hopeless 0 0 0 0 0  PHQ - 2 Score 0 0 0 0 0    No Known Allergies Social History   Social History Narrative   Patient lives at home with wife (Blake Davis). No children.   Has two pets.    Bachelors degree, works as a Visual merchandiser   Drinks caffeinated beverages   Wears his seatbelt   Smoke detector in home   Feels safe in his relationships   Past Medical History:  Diagnosis Date   Cholestasis 12/2018   per ct for kidney stone   Diabetes mellitus without complication (HCC)    Hepatic steatosis 12/2018   Extensive hepatic Seatosis per CT completed for kidney stone June 2020   Ingrowing nail, left great toe    Injury of right foot 09/22/2018   Nephrolithiasis 12/2018   2 mm nonobstructive stone   Pain in joint of left shoulder 09/10/2019   Pain in joint of right shoulder 09/10/2019   Visual changes 10/2015   Diabetic changes   Past Surgical History:  Procedure Laterality Date   FOOT SURGERY  1988   mole/nevus   NASAL SEPTUM SURGERY  1999   deviated   Family History  Problem Relation Age of Onset   Diabetes Mother    Heart disease Mother    Heart attack Mother    Alcohol abuse Paternal Uncle    Dementia Paternal Grandmother    Diabetes Paternal Grandfather    Stroke Paternal Grandfather    Cancer Paternal Grandfather    Prostate  cancer Paternal Grandfather    Allergies as of 07/02/2022   No Known Allergies      Medication List        Accurate as of July 02, 2022  8:25 AM. If you have any questions, ask your nurse or doctor.          atorvastatin 40 MG tablet Commonly known as: LIPITOR Take 1 tablet (40 mg total) by mouth daily.   dapagliflozin propanediol 10 MG Tabs tablet Commonly known as: Farxiga Take 1 tablet (10 mg total) by mouth daily.   Fish Oil 1000 MG Caps Take by mouth.   glimepiride 4 MG tablet Commonly known as: AMARYL Take 2 tablets (8 mg total) by mouth daily with breakfast.   levothyroxine 50 MCG tablet Commonly known as: SYNTHROID Take 1 tablet (50 mcg total) by mouth daily before breakfast.   metFORMIN 1000 MG tablet Commonly known as: GLUCOPHAGE 1000 mg BID with food   MULTI COMPLETE PO Take by mouth.   onetouch ultrasoft lancets   OneTouch Verio test strip Generic drug: glucose blood   Ozempic (1 MG/DOSE) 4 MG/3ML Sopn Generic drug: Semaglutide (  1 MG/DOSE) Inject 1 mg into the skin once a week.   pimecrolimus 1 % cream Commonly known as: Elidel Apply topically 2 (two) times daily.   sitaGLIPtin 100 MG tablet Commonly known as: JANUVIA Take 1 tablet (100 mg total) by mouth daily.        All past medical history, surgical history, allergies, family history, immunizations andmedications were updated in the EMR today and reviewed under the history and medication portions of their EMR.     ROS Negative, with the exception of above mentioned in HPI   Objective:  There were no vitals taken for this visit. There is no height or weight on file to calculate BMI.  Physical Exam   No results found. No results found. No results found for this or any previous visit (from the past 24 hour(s)).  Assessment/Plan: Blake DISPENZA is a 46 y.o. male present for OV for  *** Reviewed expectations re: course of current medical issues. Discussed  self-management of symptoms. Outlined signs and symptoms indicating need for more acute intervention. Patient verbalized understanding and all questions were answered. Patient received an After-Visit Summary.    No orders of the defined types were placed in this encounter.  No orders of the defined types were placed in this encounter.  Referral Orders  No referral(s) requested today     Note is dictated utilizing voice recognition software. Although note has been proof read prior to signing, occasional typographical errors still can be missed. If any questions arise, please do not hesitate to call for verification.   electronically signed by:  Felix Pacini, DO  [facility] - [place]

## 2022-07-02 NOTE — Progress Notes (Signed)
Patient ID: Blake Davis, male  DOB: [birth year], 46 y.o.   MRN: [id number] Patient Care Team    Relationship Specialty Notifications Start End  Ma Hillock, DO PCP - General Family Medicine  12/02/15   Clark-Burning, Alcide Goodness (Inactive)  Dermatology  02/20/20     Chief Complaint  Patient presents with   Diabetes    [place]; pt is fasting    Subjective: Blake Davis is a 46 y.o. male present for cmc  All past medical history, surgical history, allergies, family history, immunizations, medications and social history were updated in the electronic medical record today. All recent labs, ED visits and hospitalizations within the last year were reviewed.   Diabetes type 2 with retinopathy/HLD/obesity Pt reports he has not been completely compliant with medications daily.he is prescribed  metformin 1000 mg BID, Amaryl 4/4 mg BID, farxiga 10 and  Januvia. Patient denies dizziness, hyperglycemic or hypoglycemic events. Patient denies numbness, tingling in the extremities or nonhealing wounds of feet.  BG not routinely checked.  He has very mild chronic neuropathy.  He is prescribed statin and reports compliance   Hypothyroid: Compliance with levo 50 mcg      07/02/2022    8:46 AM 10/01/2021    8:16 AM 06/11/2020    8:21 AM 11/28/2019    8:15 AM 12/29/2018    8:14 AM  Depression screen PHQ 2/9  Decreased Interest 0 0 0 0 0  Down, Depressed, Hopeless 0 0 0 0 0  PHQ - 2 Score 0 0 0 0 0       No data to display                  12/29/2018    8:14 AM 05/22/2018    8:33 AM 04/04/2017    8:04 AM  Fall Risk   Falls in the past year? 0 No No  Follow up Falls evaluation completed        Immunization History  Administered Date(s) Administered   Influenza Inj Mdck Quad Pf 05/20/2018   Influenza Split 04/26/2015   Influenza,inj,Quad PF,6+ Mos 05/30/2017, 08/03/2019, 04/17/2021, 03/18/2022   Influenza,inj,quad, With Preservative 08/05/2019   Influenza-Unspecified  05/31/2016, 04/24/2020   Moderna SARS-COV2 Booster Vaccination 04/04/2021   Moderna Sars-Covid-2 Vaccination 08/06/2019, 09/03/2019, 05/23/2020   Pneumococcal Conjugate-13 03/03/2016   Pneumococcal Polysaccharide-23 04/04/2017   Td 07/27/1999   Tdap 08/04/2017, 09/19/2018     Past Medical History:  Diagnosis Date   Cholestasis 12/2018   per ct for kidney stone   Diabetes mellitus without complication ([place])    Hepatic steatosis 12/2018   Extensive hepatic Seatosis per CT completed for kidney stone June 2020   Ingrowing nail, left great toe    Injury of right foot 09/22/2018   Nephrolithiasis 12/2018   2 mm nonobstructive stone   Pain in joint of left shoulder 09/10/2019   Pain in joint of right shoulder 09/10/2019   Visual changes 10/2015   Diabetic changes   No Known Allergies Past Surgical History:  Procedure Laterality Date   FOOT SURGERY  1988   mole/nevus   NASAL SEPTUM SURGERY  1999   deviated   Family History  Problem Relation Age of Onset   Diabetes Mother    Heart disease Mother    Heart attack Mother    Alcohol abuse Paternal Uncle    Dementia Paternal Grandmother    Diabetes Paternal Grandfather    Stroke Paternal Grandfather    Cancer  Paternal Grandfather    Prostate cancer Paternal Grandfather    Social History   Social History Narrative   Patient lives at home with wife (Mel). No children.   Has two pets.    Bachelors degree, works as a Visual merchandiser   Drinks caffeinated beverages   Wears his seatbelt   Smoke detector in home   Feels safe in his relationships    Allergies as of 07/02/2022   No Known Allergies      Medication List        Accurate as of July 02, 2022  5:18 PM. If you have any questions, ask your nurse or doctor.          STOP taking these medications    Ozempic (1 MG/DOSE) 4 MG/3ML Sopn Generic drug: Semaglutide (1 MG/DOSE) Stopped by: Felix Pacini, DO       TAKE these medications    atorvastatin 40  MG tablet Commonly known as: LIPITOR Take 1 tablet (40 mg total) by mouth daily.   dapagliflozin propanediol 10 MG Tabs tablet Commonly known as: Farxiga Take 1 tablet (10 mg total) by mouth daily.   Fish Oil 1000 MG Caps Take by mouth.   glimepiride 4 MG tablet Commonly known as: AMARYL Take 2 tablets (8 mg total) by mouth daily with breakfast.   levothyroxine 50 MCG tablet Commonly known as: SYNTHROID Take 1 tablet (50 mcg total) by mouth daily before breakfast.   metFORMIN 1000 MG tablet Commonly known as: GLUCOPHAGE 1000 mg BID with food   MULTI COMPLETE PO Take by mouth.   onetouch ultrasoft lancets   OneTouch Verio test strip Generic drug: glucose blood   pimecrolimus 1 % cream Commonly known as: Elidel Apply topically 2 (two) times daily.   sitaGLIPtin 100 MG tablet Commonly known as: JANUVIA Take 1 tablet (100 mg total) by mouth daily.       All past medical history, surgical history, allergies, family history, immunizations andmedications were updated in the EMR today and reviewed under the history and medication portions of their EMR.     Recent Results (from the past 2160 hour(s))  HM DIABETES EYE EXAM     Status: Abnormal   Collection Time: 06/01/22 12:00 AM  Result Value Ref Range   HM Diabetic Eye Exam Retinopathy (A) No Retinopathy  POCT HgB A1C     Status: Abnormal   Collection Time: 07/02/22  8:56 AM  Result Value Ref Range   Hemoglobin A1C 9.5 (A) 4.0 - 5.6 %   HbA1c POC (<> result, manual entry) 9.5 4.0 - 5.6 %   HbA1c, POC (prediabetic range) 9.5 (A) 5.7 - 6.4 %   HbA1c, POC (controlled diabetic range) 9.5 (A) 0.0 - 7.0 %    No results found.  ROS 14 pt review of systems performed and negative (unless mentioned in an HPI)  Objective: BP 115/71   Pulse 78   Temp 98.4 F (36.9 C) (Oral)   Ht 5\' 11"  (1.803 m)   Wt 245 lb (111.1 kg)   SpO2 98%   BMI 34.17 kg/m  Physical Exam Vitals and nursing note reviewed.  Constitutional:       General: He is not in acute distress.    Appearance: Normal appearance. He is not ill-appearing, toxic-appearing or diaphoretic.  HENT:     Head: Normocephalic and atraumatic.     Mouth/Throat:     Mouth: Mucous membranes are moist.     Pharynx: Oropharynx is clear.  Eyes:     General: No scleral icterus.       Right eye: No discharge.        Left eye: No discharge.     Extraocular Movements: Extraocular movements intact.     Pupils: Pupils are equal, round, and reactive to light.  Cardiovascular:     Rate and Rhythm: Normal rate and regular rhythm.     Heart sounds: No murmur heard. Pulmonary:     Effort: Pulmonary effort is normal. No respiratory distress.     Breath sounds: Normal breath sounds. No wheezing, rhonchi or rales.  Musculoskeletal:     Cervical back: Neck supple.     Right lower leg: No edema.     Left lower leg: No edema.  Lymphadenopathy:     Cervical: No cervical adenopathy.  Skin:    General: Skin is warm and dry.     Coloration: Skin is not jaundiced or pale.     Findings: No rash.  Neurological:     Mental Status: He is alert and oriented to person, place, and time. Mental status is at baseline.  Psychiatric:        Mood and Affect: Mood normal.        Behavior: Behavior normal.        Thought Content: Thought content normal.        Judgment: Judgment normal.    Diabetic Foot Exam - Simple   Simple Foot Form Diabetic Foot exam was performed with the following findings: Yes 07/02/2022  5:15 PM  Visual Inspection Sensation Testing Pulse Check Posterior Tibialis and Dorsalis pulse intact bilaterally: Yes Comments      No results found.  Assessment/plan: ZAYLYN ERNEY is a 46 y.o. male present for [facility] Type 2 diabetes mellitus with complication, without long-term current use of insulin ([place]) Other diabetic neurological complication associated with type 2 diabetes mellitus ([place]) A1c has been trending up the last few visits.  Noncompliance  is at least part of the issue creating a barrier in achieving A1c goal less than 7. Continue metformin 1000 mg BID.  Continue januvia 100 QD Continue amaryl to 8 mg total daily- may take as BID if desired.  -Continue statin  - neuropathy stable> pt has declined gabapentin  PNA series:  completed. Flu shot: UTD (recommneded yearly) Microalb: Up-to-date Foot exam: Completed 03/18/2022 Eye exam: UTD 05/2022 [place], OD A1c: 7.3--> 6.4---> 7.6--> 7.9--> 7.9--> 8.5--> 10.3--> 6.8--> 7.6 >> 8.7> 6.8>7.1> 7.1>6.7> 7.0 >6.3 >7.2 > 7.8 >9.4>9.5 collected today Discussed referral to endocrinology and he is agreeable.  Morbid obesity (HCC)/Hyperlipidemia Stable  - continue  statin Diet and exercise modifications encouraged> they are going to start water exercises soon.   Hypothyroid: Has been stable Continue with levothyroxine 50 mcg.    Return in about 24 weeks (around 12/17/2022) for cpe (20 min).  Orders Placed This Encounter  Procedures   Ambulatory referral to Endocrinology   POCT HgB A1C   Meds ordered this encounter  Medications   sitaGLIPtin (JANUVIA) 100 MG tablet    Sig: Take 1 tablet (100 mg total) by mouth daily.    Dispense:  90 tablet    Refill:  1   metFORMIN (GLUCOPHAGE) 1000 MG tablet    Sig: 1000 mg BID with food    Dispense:  180 tablet    Refill:  1   glimepiride (AMARYL) 4 MG tablet    Sig: Take 2 tablets (8 mg total) by mouth daily with breakfast.  Dispense:  180 tablet    Refill:  1   dapagliflozin propanediol (FARXIGA) 10 MG TABS tablet    Sig: Take 1 tablet (10 mg total) by mouth daily.    Dispense:  90 tablet    Refill:  1   atorvastatin (LIPITOR) 40 MG tablet    Sig: Take 1 tablet (40 mg total) by mouth daily.    Dispense:  90 tablet    Refill:  3    Please DC lower dose.  Thanks   Referral Orders         Ambulatory referral to Endocrinology       Note is dictated utilizing voice recognition software. Although note has been proof read prior  to signing, occasional typographical errors still can be missed. If any questions arise, please do not hesitate to call for verification.  Electronically signed by: Howard Pouch, DO [place]

## 2022-11-19 ENCOUNTER — Ambulatory Visit: Payer: BC Managed Care – PPO | Admitting: Podiatry

## 2022-11-22 ENCOUNTER — Ambulatory Visit (INDEPENDENT_AMBULATORY_CARE_PROVIDER_SITE_OTHER): Payer: BC Managed Care – PPO | Admitting: Podiatry

## 2022-11-22 ENCOUNTER — Encounter: Payer: Self-pay | Admitting: Podiatry

## 2022-11-22 DIAGNOSIS — E1142 Type 2 diabetes mellitus with diabetic polyneuropathy: Secondary | ICD-10-CM | POA: Diagnosis not present

## 2022-11-22 DIAGNOSIS — B351 Tinea unguium: Secondary | ICD-10-CM | POA: Diagnosis not present

## 2022-11-22 NOTE — Progress Notes (Signed)
This patient presents to the office for evaluation of his second toenail  both feet and breaking of inner border right big toenail.  He says he is diabetic with neuropathy.   He presents to the office with no pain or discomfort.  He is concerned about his nails since he is diabetic.   Vascular  Dorsalis pedis and posterior tibial pulses are palpable  B/L.  Capillary return  WNL.  Temperature gradient is  WNL.  Skin turgor  WNL  Sensorium  Senn Weinstein monofilament wire diminished/absent.. Normal tactile sensation.  Nail Exam  Patient has thick  nails with no evidence of bacterial or fungal infection.  Thickened second toenails  B/L.  Nail spicule growing medial border right big toe.  No redness or swelling noted.  Orthopedic  Exam  Muscle tone and muscle strength  WNL.  No limitations of motion feet  B/L.  No crepitus or joint effusion noted.  Foot type is unremarkable and digits show no abnormalities.  Bony prominences are unremarkable.  Hammer toe second right.  Skin  No open lesions.  Normal skin texture and turgor.  Asymptomatic callus sub 1st MPJ  B/L.  Onychomycosis  B/L.  Debride  toenails with nail nipper and dremel tool. Debride nail spicule right hallux. Diabetic foot exam reveals diminished/absent LOPS  B/L. Discussed possible future surgery for elimination of nail spicule.  Helane Gunther DPM

## 2022-12-08 ENCOUNTER — Other Ambulatory Visit: Payer: BC Managed Care – PPO

## 2022-12-16 ENCOUNTER — Telehealth: Payer: Self-pay

## 2022-12-16 NOTE — Telephone Encounter (Signed)
Please assist with confirming if pt is due for repeat labs. Pt is scheduled 6/3 for thyroid las. No orders placed. Pt was last seen 12/8 for [facility] f/u.

## 2022-12-17 NOTE — Telephone Encounter (Signed)
It does not appear he is due for all of his labs until August. A1c will be completed for his diabetes at upcoming appointment.

## 2022-12-17 NOTE — Telephone Encounter (Signed)
Pt will not be due for next physical until August, last one 02/2022.   Please further advise if any labs needed currently.

## 2022-12-17 NOTE — Telephone Encounter (Signed)
Pt's appt will need to be changed to provider appt but she does not have 8am slot on 6/3. If pt is ok to move appt 1 day, he can see her on 6/4 at 8am.

## 2022-12-21 NOTE — Telephone Encounter (Signed)
Attempted to contact pt and was unable to LVM 

## 2022-12-27 ENCOUNTER — Other Ambulatory Visit: Payer: BC Managed Care – PPO

## 2022-12-27 DIAGNOSIS — Z1211 Encounter for screening for malignant neoplasm of colon: Secondary | ICD-10-CM

## 2022-12-31 ENCOUNTER — Ambulatory Visit: Payer: BC Managed Care – PPO | Admitting: Family Medicine

## 2022-12-31 ENCOUNTER — Encounter: Payer: Self-pay | Admitting: Family Medicine

## 2022-12-31 VITALS — BP 122/75 | HR 82 | Temp 98.0°F | Wt 231.0 lb

## 2022-12-31 DIAGNOSIS — E785 Hyperlipidemia, unspecified: Secondary | ICD-10-CM | POA: Diagnosis not present

## 2022-12-31 DIAGNOSIS — Z7984 Long term (current) use of oral hypoglycemic drugs: Secondary | ICD-10-CM | POA: Insufficient documentation

## 2022-12-31 DIAGNOSIS — E039 Hypothyroidism, unspecified: Secondary | ICD-10-CM

## 2022-12-31 DIAGNOSIS — E782 Mixed hyperlipidemia: Secondary | ICD-10-CM | POA: Diagnosis not present

## 2022-12-31 DIAGNOSIS — E118 Type 2 diabetes mellitus with unspecified complications: Secondary | ICD-10-CM

## 2022-12-31 DIAGNOSIS — E1169 Type 2 diabetes mellitus with other specified complication: Secondary | ICD-10-CM | POA: Diagnosis not present

## 2022-12-31 DIAGNOSIS — E1142 Type 2 diabetes mellitus with diabetic polyneuropathy: Secondary | ICD-10-CM

## 2022-12-31 LAB — COMPREHENSIVE METABOLIC PANEL
ALT: 34 U/L (ref 0–53)
AST: 22 U/L (ref 0–37)
Albumin: 4.5 g/dL (ref 3.5–5.2)
Alkaline Phosphatase: 60 U/L (ref 39–117)
BUN: 14 mg/dL (ref 6–23)
CO2: 16 mEq/L — ABNORMAL LOW (ref 19–32)
Calcium: 9.4 mg/dL (ref 8.4–10.5)
Chloride: 101 mEq/L (ref 96–112)
Creatinine, Ser: 1.13 mg/dL (ref 0.40–1.50)
GFR: 76.69 mL/min (ref >60.00)
Glucose, Bld: 490 mg/dL — ABNORMAL HIGH (ref 70–99)
Potassium: 4.2 mEq/L (ref 3.5–5.1)
Sodium: 133 mEq/L — ABNORMAL LOW (ref 135–145)
Total Bilirubin: 0.6 mg/dL (ref 0.2–1.2)
Total Protein: 7.5 g/dL (ref 6.0–8.3)

## 2022-12-31 LAB — POCT GLYCOSYLATED HEMOGLOBIN (HGB A1C)
HbA1c POC (<> result, manual entry): 10.5 % (ref 4.0–5.6)
HbA1c, POC (controlled diabetic range): 10.5 % — AB (ref 0.0–7.0)
HbA1c, POC (prediabetic range): 10.5 % — AB (ref 5.7–6.4)
Hemoglobin A1C: 10.5 % — AB (ref 4.0–5.6)

## 2022-12-31 LAB — MICROALBUMIN / CREATININE URINE RATIO
Creatinine,U: 49.8 mg/dL
Microalb Creat Ratio: 1.4 mg/g (ref 0.0–30.0)
Microalb, Ur: <0.7 mg/dL (ref 0.0–1.9)

## 2022-12-31 LAB — TSH: TSH: 4.19 u[IU]/mL (ref 0.35–5.50)

## 2022-12-31 MED ORDER — DAPAGLIFLOZIN PROPANEDIOL 10 MG PO TABS: 10.0000 mg | ORAL_TABLET | Freq: Every day | ORAL | 1 refills | Status: DC

## 2022-12-31 MED ORDER — GLIMEPIRIDE 4 MG PO TABS: 8.0000 mg | ORAL_TABLET | Freq: Every day | ORAL | 1 refills | Status: DC

## 2022-12-31 MED ORDER — SITAGLIPTIN PHOSPHATE 100 MG PO TABS: 100.0000 mg | ORAL_TABLET | Freq: Every day | ORAL | 1 refills | Status: DC

## 2022-12-31 MED ORDER — METFORMIN HCL 1000 MG PO TABS: ORAL_TABLET | ORAL | 1 refills | Status: DC

## 2022-12-31 NOTE — Patient Instructions (Addendum)
No follow-ups on file.  [facility] Address: [address], [place], [place] [address] Hours:  Open ? Closes 5?PM Phone: [phone]      Great to see you today.  I have refilled the medication(s) we provide.   If labs were collected, we will inform you of lab results once received either by echart message or telephone call.   - echart message- for normal results that have been seen by the patient already.   - telephone call: abnormal results or if patient has not viewed results in their echart.

## 2022-12-31 NOTE — Progress Notes (Signed)
Patient ID: Blake Davis, male  DOB: [birth year], 47 y.o.   MRN: [id number] Patient Care Team    Relationship Specialty Notifications Start End  Natalia Leatherwood, DO PCP - General Family Medicine  12/02/15   Clark-Burning, Gertie Baron (Inactive)  Dermatology  02/20/20     Chief Complaint  Patient presents with   Diabetes    Subjective: Blake Davis is a 47 y.o. male present for cmc  All past medical history, surgical history, allergies, family history, immunizations, medications and social history were updated in the electronic medical record today. All recent labs, ED visits and hospitalizations within the last year were reviewed.   Diabetes type 2 with retinopathy/HLD/obesity Pt reports he has not been completely compliant with medications daily. He is prescribed  metformin 1000 mg BID, Amaryl 4/4 mg BID, farxiga 10 and  Januvia.  Per pharmacy records he has picked up his Januvia in a compliant manner.  Unfortunately, it appears he is not compliant with metformin, Amaryl or Marcelline Deist all of which have not been filled since December 2023.  Today he is inquiring about the endocrinology referral. Placed 12/8 for him.  His wife is also a diabetic and is a patient of [facility]. Patient denies dizziness, hyperglycemic or hypoglycemic events. Patient denies numbness, tingling in the extremities or nonhealing wounds of feet.  BG not routinely checked-per patient choice He has very mild chronic neuropathy.  He is prescribed statin and reports compliance  Hypothyroid: Patient reports compliance with levo 50 mcg      07/02/2022    8:46 AM 10/01/2021    8:16 AM 06/11/2020    8:21 AM 11/28/2019    8:15 AM 12/29/2018    8:14 AM  Depression screen PHQ 2/9  Decreased Interest 0 0 0 0 0  Down, Depressed, Hopeless 0 0 0 0 0  PHQ - 2 Score 0 0 0 0 0       No data to display                 12/29/2018    8:14 AM 05/22/2018    8:33 AM 04/04/2017    8:04 AM  Fall Risk    Falls in the past year? 0 No No  Follow up Falls evaluation completed      Immunization History  Administered Date(s) Administered   Influenza Inj Mdck Quad Pf 05/20/2018   Influenza Split 04/26/2015   Influenza,inj,Quad PF,6+ Mos 05/30/2017, 08/03/2019, 04/17/2021, 03/18/2022   Influenza,inj,quad, With Preservative 08/05/2019   Influenza-Unspecified 05/31/2016, 04/24/2020   Moderna SARS-COV2 Booster Vaccination 04/04/2021   Moderna Sars-Covid-2 Vaccination 08/06/2019, 09/03/2019, 05/23/2020   Pneumococcal Conjugate-13 03/03/2016   Pneumococcal Polysaccharide-23 04/04/2017   Td 07/27/1999   Tdap 08/04/2017, 09/19/2018    Past Medical History:  Diagnosis Date   Cholestasis 12/2018   per ct for kidney stone   Diabetes mellitus without complication (HCC)    Hepatic steatosis 12/2018   Extensive hepatic Seatosis per CT completed for kidney stone June 2020   Ingrowing nail, left great toe    Injury of right foot 09/22/2018   Nephrolithiasis 12/2018   2 mm nonobstructive stone   Pain in joint of left shoulder 09/10/2019   Pain in joint of right shoulder 09/10/2019   Visual changes 10/2015   Diabetic changes   No Known Allergies Past Surgical History:  Procedure Laterality Date   FOOT SURGERY  1988   mole/nevus   NASAL SEPTUM SURGERY  1999  deviated   Family History  Problem Relation Age of Onset   Diabetes Mother    Heart disease Mother    Heart attack Mother    Alcohol abuse Paternal Uncle    Dementia Paternal Grandmother    Diabetes Paternal Grandfather    Stroke Paternal Grandfather    Cancer Paternal Grandfather    Prostate cancer Paternal Grandfather    Social History   Social History Narrative   Patient lives at home with wife (Blake Davis). No children.   Has two pets.    Bachelors degree, works as a Visual merchandiser   Drinks caffeinated beverages   Wears his seatbelt   Smoke detector in home   Feels safe in his relationships    Allergies as of  12/31/2022   No Known Allergies      Medication List        Accurate as of December 31, 2022  4:35 PM. If you have any questions, ask your nurse or doctor.          atorvastatin 40 MG tablet Commonly known as: LIPITOR Take 1 tablet (40 mg total) by mouth daily.   dapagliflozin propanediol 10 MG Tabs tablet Commonly known as: Farxiga Take 1 tablet (10 mg total) by mouth daily.   Fish Oil 1000 MG Caps Take by mouth.   glimepiride 4 MG tablet Commonly known as: AMARYL Take 2 tablets (8 mg total) by mouth daily with breakfast.   levothyroxine 50 MCG tablet Commonly known as: SYNTHROID Take 1 tablet (50 mcg total) by mouth daily before breakfast.   metFORMIN 1000 MG tablet Commonly known as: GLUCOPHAGE 1000 mg BID with food   MULTI COMPLETE PO Take by mouth.   onetouch ultrasoft lancets   OneTouch Verio test strip Generic drug: glucose blood   pimecrolimus 1 % cream Commonly known as: Elidel Apply topically 2 (two) times daily.   sitaGLIPtin 100 MG tablet Commonly known as: JANUVIA Take 1 tablet (100 mg total) by mouth daily.       All past medical history, surgical history, allergies, family history, immunizations andmedications were updated in the EMR today and reviewed under the history and medication portions of their EMR.     Recent Results (from the past 2160 hour(s))  POCT HgB A1C     Status: Abnormal   Collection Time: 12/31/22  8:33 AM  Result Value Ref Range   Hemoglobin A1C 10.5 (A) 4.0 - 5.6 %   HbA1c POC (<> result, manual entry) 10.5 4.0 - 5.6 %   HbA1c, POC (prediabetic range) 10.5 (A) 5.7 - 6.4 %   HbA1c, POC (controlled diabetic range) 10.5 (A) 0.0 - 7.0 %    No results found.  ROS 14 pt review of systems performed and negative (unless mentioned in an HPI)  Objective: BP 122/75   Pulse 82   Temp 98 F (36.7 C)   Wt 231 lb (104.8 kg)   SpO2 97%   BMI 32.22 kg/m  Physical Exam Vitals and nursing note reviewed.  Constitutional:       General: He is not in acute distress.    Appearance: Normal appearance. He is not ill-appearing, toxic-appearing or diaphoretic.  HENT:     Head: Normocephalic and atraumatic.     Mouth/Throat:     Mouth: Mucous membranes are moist.     Pharynx: Oropharynx is clear.  Eyes:     General: No scleral icterus.       Right eye: No discharge.  Left eye: No discharge.     Extraocular Movements: Extraocular movements intact.     Pupils: Pupils are equal, round, and reactive to light.  Cardiovascular:     Rate and Rhythm: Normal rate and regular rhythm.     Heart sounds: No murmur heard. Pulmonary:     Effort: Pulmonary effort is normal. No respiratory distress.     Breath sounds: Normal breath sounds. No wheezing, rhonchi or rales.  Musculoskeletal:     Cervical back: Neck supple.     Right lower leg: No edema.     Left lower leg: No edema.  Lymphadenopathy:     Cervical: No cervical adenopathy.  Skin:    General: Skin is warm and dry.     Coloration: Skin is not jaundiced or pale.     Findings: No rash.  Neurological:     Mental Status: He is alert and oriented to person, place, and time. Mental status is at baseline.  Psychiatric:        Mood and Affect: Mood normal.        Behavior: Behavior normal.        Thought Content: Thought content normal.        Judgment: Judgment normal.      No results found.  Assessment/plan: ALEKS LILJA is a 47 y.o. male present for [facility] Type 2 diabetes mellitus with complication, without long-term current use of insulin (HCC) Other diabetic neurological complication associated with type 2 diabetes mellitus (HCC) A1c has been trending up the last few visits.  Noncompliance is at least part of the issue creating a barrier in achieving A1c goal less than 7. Continue metformin 1000 mg BID.  Continue januvia 100 QD Continue amaryl to 8 mg total daily- may take as BID if desired.  Continue statin  - neuropathy stable> pt has  declined gabapentin  PNA series:  completed. Flu shot: UTD (recommneded yearly) Microalb: Elected today 12/31/2022 Foot exam: Completed 12/31/2022 Eye exam: UTD 05/2022 Heisler, OD A1c: 7.3--> 6.4---> 7.6--> 7.9--> 7.9--> 8.5--> 10.3--> 6.8--> 7.6 >> 8.7> 6.8>7.1> 7.1>6.7> 7.0 >6.3 >7.2 > 7.8 >9.4>9.5> 10.5 collected today Discussed referral to endocrinology 12/8 and he was agreeable. Endo reached out to schedule him and he never responded back to them.   Morbid obesity (HCC)/Hyperlipidemia Stable  - continue  statin Diet and exercise modifications encouraged> they are going to start water exercises soon.   Hypothyroid: Has been stable Continue with levothyroxine 50 mcg.    Return in about 11 weeks (around 03/18/2023) for cpe (20 min).  Orders Placed This Encounter  Procedures   Urine Microalbumin w/creat. ratio   Comp Met (CMET)   TSH   Ambulatory referral to Endocrinology   POCT HgB A1C   Meds ordered this encounter  Medications   dapagliflozin propanediol (FARXIGA) 10 MG TABS tablet    Sig: Take 1 tablet (10 mg total) by mouth daily.    Dispense:  90 tablet    Refill:  1   glimepiride (AMARYL) 4 MG tablet    Sig: Take 2 tablets (8 mg total) by mouth daily with breakfast.    Dispense:  180 tablet    Refill:  1   metFORMIN (GLUCOPHAGE) 1000 MG tablet    Sig: 1000 mg BID with food    Dispense:  180 tablet    Refill:  1   sitaGLIPtin (JANUVIA) 100 MG tablet    Sig: Take 1 tablet (100 mg total) by mouth daily.  Dispense:  90 tablet    Refill:  1   Referral Orders         Ambulatory referral to Endocrinology        Note is dictated utilizing voice recognition software. Although note has been proof read prior to signing, occasional typographical errors still can be missed. If any questions arise, please do not hesitate to call for verification.  Electronically signed by: Felix Pacini, DO [facility] [place]

## 2023-02-23 ENCOUNTER — Encounter (INDEPENDENT_AMBULATORY_CARE_PROVIDER_SITE_OTHER): Payer: Self-pay

## 2023-03-21 ENCOUNTER — Encounter: Payer: BC Managed Care – PPO | Admitting: Family Medicine

## 2023-03-21 DIAGNOSIS — Z23 Encounter for immunization: Secondary | ICD-10-CM

## 2023-03-21 DIAGNOSIS — Z1211 Encounter for screening for malignant neoplasm of colon: Secondary | ICD-10-CM

## 2023-03-24 ENCOUNTER — Other Ambulatory Visit: Payer: Self-pay | Admitting: Family Medicine

## 2023-05-02 ENCOUNTER — Ambulatory Visit: Payer: BC Managed Care – PPO | Admitting: Podiatry

## 2023-05-05 ENCOUNTER — Ambulatory Visit: Payer: BC Managed Care – PPO | Admitting: Podiatry

## 2023-05-05 ENCOUNTER — Encounter: Payer: Self-pay | Admitting: Podiatry

## 2023-05-05 DIAGNOSIS — B351 Tinea unguium: Secondary | ICD-10-CM

## 2023-05-05 DIAGNOSIS — E1142 Type 2 diabetes mellitus with diabetic polyneuropathy: Secondary | ICD-10-CM

## 2023-05-05 NOTE — Progress Notes (Signed)
This patient presents to the office for evaluation of his second toenail left foot.  He says he is diabetic with neuropathy.   He presents to the office with no pain or discomfort.  He is concerned about his nails since he is diabetic.   Vascular  Dorsalis pedis and posterior tibial pulses are palpable  B/L.  Capillary return  WNL.  Temperature gradient is  WNL.  Skin turgor  WNL  Sensorium  Senn Weinstein monofilament wire diminished/absent.. Normal tactile sensation.  Nail Exam  Patient has thick  nails with no evidence of bacterial or fungal infection.  Thickened second toenails  B/L.    Orthopedic  Exam  Muscle tone and muscle strength  WNL.  No limitations of motion feet  B/L.  No crepitus or joint effusion noted.  Foot type is unremarkable and digits show no abnormalities.  Bony prominences are unremarkable.  Hammer toe second right.  Skin  No open lesions.  Normal skin texture and turgor.  Asymptomatic callus sub 1st MPJ  B/L.  Onychomycosis  2 left foot.  Debride  toenails with nail nipper and dremel tool.  Diabetic foot exam reveals diminished/absent LOPS  B/L. Discussed possible future surgery for elimination of second toenail left foot.  Patient to contact Dr.  Charlsie Merles or Dr.  Allena Katz.  Helane Gunther DPM

## 2023-05-10 ENCOUNTER — Ambulatory Visit (INDEPENDENT_AMBULATORY_CARE_PROVIDER_SITE_OTHER): Payer: BC Managed Care – PPO | Admitting: Family Medicine

## 2023-05-10 ENCOUNTER — Encounter: Payer: Self-pay | Admitting: Family Medicine

## 2023-05-10 VITALS — BP 128/70 | HR 85 | Temp 98.0°F | Ht 71.0 in | Wt 237.6 lb

## 2023-05-10 DIAGNOSIS — Z0000 Encounter for general adult medical examination without abnormal findings: Secondary | ICD-10-CM

## 2023-05-10 DIAGNOSIS — Z23 Encounter for immunization: Secondary | ICD-10-CM

## 2023-05-10 DIAGNOSIS — Z7984 Long term (current) use of oral hypoglycemic drugs: Secondary | ICD-10-CM | POA: Diagnosis not present

## 2023-05-10 DIAGNOSIS — Z125 Encounter for screening for malignant neoplasm of prostate: Secondary | ICD-10-CM | POA: Diagnosis not present

## 2023-05-10 DIAGNOSIS — E782 Mixed hyperlipidemia: Secondary | ICD-10-CM

## 2023-05-10 DIAGNOSIS — Z1211 Encounter for screening for malignant neoplasm of colon: Secondary | ICD-10-CM

## 2023-05-10 DIAGNOSIS — E118 Type 2 diabetes mellitus with unspecified complications: Secondary | ICD-10-CM | POA: Diagnosis not present

## 2023-05-10 LAB — CBC
HCT: 48.4 % (ref 39.0–52.0)
Hemoglobin: 15.7 g/dL (ref 13.0–17.0)
MCHC: 32.5 g/dL (ref 30.0–36.0)
MCV: 89 fL (ref 78.0–100.0)
Platelets: 187 10*3/uL (ref 150.0–400.0)
RBC: 5.44 Mil/uL (ref 4.22–5.81)
RDW: 13.6 % (ref 11.5–15.5)
WBC: 6.8 10*3/uL (ref 4.0–10.5)

## 2023-05-10 LAB — LIPID PANEL
Cholesterol: 151 mg/dL (ref 0–200)
HDL: 29.7 mg/dL — ABNORMAL LOW (ref >39.00)
LDL Cholesterol: 49 mg/dL (ref 0–99)
NonHDL: 121.12
Total CHOL/HDL Ratio: 5
Triglycerides: 362 mg/dL — ABNORMAL HIGH (ref 0.0–149.0)
VLDL: 72.4 mg/dL — ABNORMAL HIGH (ref 0.0–40.0)

## 2023-05-10 LAB — COMPREHENSIVE METABOLIC PANEL
ALT: 51 U/L (ref 0–53)
AST: 35 U/L (ref 0–37)
Albumin: 4.3 g/dL (ref 3.5–5.2)
Alkaline Phosphatase: 57 U/L (ref 39–117)
BUN: 16 mg/dL (ref 6–23)
CO2: 25 meq/L (ref 19–32)
Calcium: 9.6 mg/dL (ref 8.4–10.5)
Chloride: 102 meq/L (ref 96–112)
Creatinine, Ser: 0.95 mg/dL (ref 0.40–1.50)
GFR: 94.2 mL/min (ref >60.00)
Glucose, Bld: 325 mg/dL — ABNORMAL HIGH (ref 70–99)
Potassium: 3.9 meq/L (ref 3.5–5.1)
Sodium: 137 meq/L (ref 135–145)
Total Bilirubin: 0.6 mg/dL (ref 0.2–1.2)
Total Protein: 7 g/dL (ref 6.0–8.3)

## 2023-05-10 LAB — HEMOGLOBIN A1C: Hgb A1c MFr Bld: 11.4 % — ABNORMAL HIGH (ref 4.6–6.5)

## 2023-05-10 LAB — TSH: TSH: 4.63 u[IU]/mL (ref 0.35–5.50)

## 2023-05-10 LAB — PSA: PSA: 0.24 ng/mL (ref 0.10–4.00)

## 2023-05-10 MED ORDER — GLIMEPIRIDE 4 MG PO TABS: 8.0000 mg | ORAL_TABLET | Freq: Every day | ORAL | 1 refills | Status: DC

## 2023-05-10 MED ORDER — METFORMIN HCL 1000 MG PO TABS: ORAL_TABLET | ORAL | 1 refills | Status: DC

## 2023-05-10 MED ORDER — SITAGLIPTIN PHOSPHATE 100 MG PO TABS: 100.0000 mg | ORAL_TABLET | Freq: Every day | ORAL | 1 refills | Status: DC

## 2023-05-10 MED ORDER — DAPAGLIFLOZIN PROPANEDIOL 10 MG PO TABS: 10.0000 mg | ORAL_TABLET | Freq: Every day | ORAL | 1 refills | Status: DC

## 2023-05-10 MED ORDER — ATORVASTATIN CALCIUM 40 MG PO TABS
40.0000 mg | ORAL_TABLET | Freq: Every day | ORAL | 3 refills | Status: DC
Start: 2023-05-10 — End: 2023-12-14

## 2023-05-10 NOTE — Patient Instructions (Addendum)
Return in about 15 weeks (around 08/23/2023) for Routine chronic condition follow-up.  [facility]. Please give [place] a call to schedule an appointment [phone]       [facility] to see you today.  I have refilled the medication(s) we provide.   If labs were collected or images ordered, we will inform you of  results once we have received them and reviewed. We will contact you either by echart message, or telephone call.  Please give ample time to the testing facility, and our office to run,  receive and review results. Please do not call inquiring of results, even if you can see them in your chart. We will contact you as soon as we are able. If it has been over 1 week since the test was completed, and you have not yet heard from [place], then please call us.    - echart message- for normal results that have been seen by the patient already.   - telephone call: abnormal results or if patient has not viewed results in their echart.  If a referral to a specialist was entered for you, please call us in 2 weeks if you have not heard from the specialist office to schedule.

## 2023-05-10 NOTE — Progress Notes (Signed)
Patient ID: Blake Davis, male  DOB: October 23, 1975, 47 y.o.   MRN: [id number] Patient Care Team    Relationship Specialty Notifications Start End  Natalia Leatherwood, DO PCP - General Family Medicine  12/02/15   Derenda Mis, Cordelia Poche (Inactive)  Dermatology  02/20/20   Armbruster, Willaim Rayas, MD Consulting Physician Gastroenterology  05/10/23   Helane Gunther, DPM Consulting Physician Podiatry  05/10/23     Chief Complaint  Patient presents with   Annual Exam    Pt is fasting     Subjective: Blake Davis is a 47 y.o. male present for CPE/cmc combination appointment All past medical history, surgical history, allergies, family history, immunizations, medications and social history were updated in the electronic medical record today. All recent labs, ED visits and hospitalizations within the last year were reviewed.  Health maintenance:  Colonoscopy:agreeable to cologuard .never completed Immunizations:  tdap UTD 2020, influenza administered today, PNA series completed.  Discussed starting shingles series after her 48th birthday. Infectious disease screening: HIV and Hep C completed. If not completed prior, screening test offered. PSA:  Lab Results  Component Value Date   PSA 0.25 03/22/2022  , pt was counseled on prostate cancer screenings. History of prostate cancer in paternal grandfather.  PSA collected today Assistive device: None Oxygen use: None Patient has a Dental home. Hospitalizations/ED visits: Reviewed  Diabetes type 2 with retinopathy/HLD/obesity Pt reports compliance with metformin 1000 mg BID, Amaryl 4/4 mg BID, farxiga 10 and  Januvia.  Patient denies dizziness, hyperglycemic or hypoglycemic events. Patient denies numbness, tingling in the extremities or nonhealing wounds of feet.  BG not routinely checked.  He has very mild chronic neuropathy.  He is prescribed statin and reports compliance   Hypothyroid: Patient reports compliance with levo 50  mcg      05/10/2023    9:02 AM 07/02/2022    8:46 AM 10/01/2021    8:16 AM 06/11/2020    8:21 AM 11/28/2019    8:15 AM  Depression screen PHQ 2/9  Decreased Interest 0 0 0 0 0  Down, Depressed, Hopeless 0 0 0 0 0  PHQ - 2 Score 0 0 0 0 0       No data to display                 12/29/2018    8:14 AM 05/22/2018    8:33 AM 04/04/2017    8:04 AM  Fall Risk   Falls in the past year? 0 No No  Follow up Falls evaluation completed      Immunization History  Administered Date(s) Administered   Influenza Inj Mdck Quad Pf 05/20/2018   Influenza Split 04/26/2015   Influenza, Seasonal, Injecte, Preservative Fre 05/10/2023   Influenza,inj,Quad PF,6+ Mos 05/30/2017, 08/03/2019, 04/17/2021, 03/18/2022   Influenza,inj,quad, With Preservative 08/05/2019   Influenza-Unspecified 05/31/2016, 04/24/2020   Moderna SARS-COV2 Booster Vaccination 04/04/2021   Moderna Sars-Covid-2 Vaccination 08/06/2019, 09/03/2019, 05/23/2020   Pneumococcal Conjugate-13 03/03/2016   Pneumococcal Polysaccharide-23 04/04/2017   Td 07/27/1999   Tdap 08/04/2017, 09/19/2018    Past Medical History:  Diagnosis Date   Cholestasis 12/2018   per ct for kidney stone   Diabetes mellitus without complication (HCC)    Hepatic steatosis 12/2018   Extensive hepatic Seatosis per CT completed for kidney stone June 2020   Ingrowing nail, left great toe    Injury of right foot 09/22/2018   Nephrolithiasis 12/2018   2 mm nonobstructive stone   Pain  in joint of left shoulder 09/10/2019   Pain in joint of right shoulder 09/10/2019   Visual changes 10/2015   Diabetic changes   No Known Allergies Past Surgical History:  Procedure Laterality Date   FOOT SURGERY  1988   mole/nevus   NASAL SEPTUM SURGERY  1999   deviated   Family History  Problem Relation Age of Onset   Diabetes Mother    Heart disease Mother    Heart attack Mother    Alcohol abuse Paternal Uncle    Dementia Paternal Grandmother    Diabetes Paternal  Grandfather    Stroke Paternal Grandfather    Cancer Paternal Grandfather    Prostate cancer Paternal Grandfather    Social History   Social History Narrative   Patient lives at home with wife (Mel). No children.   Has two pets.    Bachelors degree, works as a Visual merchandiser   Drinks caffeinated beverages   Wears his seatbelt   Smoke detector in home   Feels safe in his relationships    Allergies as of 05/10/2023   No Known Allergies      Medication List        Accurate as of May 10, 2023 11:16 AM. If you have any questions, ask your nurse or doctor.          atorvastatin 40 MG tablet Commonly known as: LIPITOR Take 1 tablet (40 mg total) by mouth daily.   dapagliflozin propanediol 10 MG Tabs tablet Commonly known as: Farxiga Take 1 tablet (10 mg total) by mouth daily.   Fish Oil 1000 MG Caps Take by mouth.   glimepiride 4 MG tablet Commonly known as: AMARYL Take 2 tablets (8 mg total) by mouth daily with breakfast.   levothyroxine 50 MCG tablet Commonly known as: SYNTHROID TAKE 1 TABLET BY MOUTH DAILY BEFORE BREAKFAST   metFORMIN 1000 MG tablet Commonly known as: GLUCOPHAGE 1000 mg BID with food   MULTI COMPLETE PO Take by mouth.   onetouch ultrasoft lancets   OneTouch Verio test strip Generic drug: glucose blood   pimecrolimus 1 % cream Commonly known as: Elidel Apply topically 2 (two) times daily.   sitaGLIPtin 100 MG tablet Commonly known as: JANUVIA Take 1 tablet (100 mg total) by mouth daily.       All past medical history, surgical history, allergies, family history, immunizations andmedications were updated in the EMR today and reviewed under the history and medication portions of their EMR.     No results found for this or any previous visit (from the past 2160 hour(s)).  No results found.  ROS 14 pt review of systems performed and negative (unless mentioned in an HPI)  Objective: BP 128/70   Pulse 85   Temp 98  F (36.7 C)   Ht 5\' 11"  (1.803 m)   Wt 237 lb 9.6 oz (107.8 kg)   SpO2 98%   BMI 33.14 kg/m  Physical Exam Constitutional:      General: He is not in acute distress.    Appearance: Normal appearance. He is obese. He is not ill-appearing, toxic-appearing or diaphoretic.  HENT:     Head: Normocephalic and atraumatic.     Right Ear: Tympanic membrane, ear canal and external ear normal. There is no impacted cerumen.     Left Ear: Tympanic membrane, ear canal and external ear normal. There is no impacted cerumen.     Nose: Nose normal. No congestion or rhinorrhea.  Mouth/Throat:     Mouth: Mucous membranes are moist.     Pharynx: Oropharynx is clear. No oropharyngeal exudate or posterior oropharyngeal erythema.  Eyes:     General: No scleral icterus.       Right eye: No discharge.        Left eye: No discharge.     Extraocular Movements: Extraocular movements intact.     Pupils: Pupils are equal, round, and reactive to light.  Cardiovascular:     Rate and Rhythm: Normal rate and regular rhythm.     Pulses: Normal pulses.     Heart sounds: Normal heart sounds. No murmur heard.    No friction rub. No gallop.  Pulmonary:     Effort: Pulmonary effort is normal. No respiratory distress.     Breath sounds: Normal breath sounds. No stridor. No wheezing, rhonchi or rales.  Chest:     Chest wall: No tenderness.  Abdominal:     General: Abdomen is flat. Bowel sounds are normal. There is no distension.     Palpations: Abdomen is soft. There is no mass.     Tenderness: There is no abdominal tenderness. There is no right CVA tenderness, left CVA tenderness, guarding or rebound.     Hernia: No hernia is present.  Musculoskeletal:        General: No swelling or tenderness. Normal range of motion.     Cervical back: Normal range of motion and neck supple.     Right lower leg: No edema.     Left lower leg: No edema.  Lymphadenopathy:     Cervical: No cervical adenopathy.  Skin:     General: Skin is warm and dry.     Coloration: Skin is not jaundiced.     Findings: No bruising, lesion or rash.  Neurological:     General: No focal deficit present.     Mental Status: He is alert and oriented to person, place, and time. Mental status is at baseline.     Cranial Nerves: No cranial nerve deficit.     Sensory: No sensory deficit.     Motor: No weakness.     Coordination: Coordination normal.     Gait: Gait normal.     Deep Tendon Reflexes: Reflexes normal.  Psychiatric:        Mood and Affect: Mood normal.        Behavior: Behavior normal.        Thought Content: Thought content normal.        Judgment: Judgment normal.     No results found.  Assessment/plan: KEYNAN HEFFERN is a 47 y.o. male present for CPE and Chronic Conditions/illness Management Type 2 diabetes mellitus with complication, without long-term current use of insulin (HCC) Other diabetic neurological complication associated with type 2 diabetes mellitus (HCC) A1c has been trending up the last few visits. Continue metformin 1000 mg BID.  Continue  januvia 100 QD Continue amaryl to 8 mg total daily- may take as BID if desired.  -Patient never started Ozempic prior  visit.  Is willing to start tapering now if A1c warrants. Only used farxiga for 3 months,  We will wait on the results of his A1c today to either continue the farxiga 10 mg  QD or Ozempic (again) -Continue statin  - neuropathy stable> pt has declined gabapentin  PNA series:  completed. 04/04/2017 Flu shot: completed today (recommneded yearly) Microalb: Up-to-date Foot exam: Completed 03/18/2022 Eye exam:  10/21/2020 Daisey Must, OD> DUE! A1c:  7.3--> 6.4---> 7.6--> 7.9--> 7.9--> 8.5--> 10.3--> 6.8--> 7.6 >> 8.7> 6.8>7.1> 7.1>6.7> 7.0 >6.3 >7.2 > 7.8 > 9.5> 7.5>10.5> A1c collected today  Morbid obesity (HCC)/Hyperlipidemia Stable Continue atorvastatin Diet and exercise modifications encouraged  CMP and lipid  panel  Hypothyroid: Stable Continue with levothyroxine 50 mcg.  TSH collected today.  Once results received we will call in refills of levothyroxine at appropriate dose. TSH  Need for immunization against influenza -influenza vaccine administered today Prostate cancer screening/Family history of prostate cancer -PSA collected today Colon cancer screening -cologuard re-ordered Routine general medical examination at a health care facility Patient was encouraged to exercise greater than 150 minutes a week. Patient was encouraged to choose a diet filled with fresh fruits and vegetables, and lean meats. AVS provided to patient today for education/recommendation on gender specific health and safety maintenance. Colonoscopy:agreeable to cologuard .never completed Immunizations:  tdap UTD 2020, influenza administered today, PNA series completed.  Discussed starting shingles series after her 48th birthday. Infectious disease screening: HIV and Hep C completed. If not completed prior, screening test offered.  Return in about 15 weeks (around 08/23/2023) for Routine chronic condition follow-up.  Orders Placed This Encounter  Procedures   Flu vaccine trivalent PF, 6mos and older(Flulaval,Afluria,Fluarix,Fluzone)   CBC   Comprehensive metabolic panel   Hemoglobin A1c   TSH   PSA   Lipid panel   Cologuard   Meds ordered this encounter  Medications   atorvastatin (LIPITOR) 40 MG tablet    Sig: Take 1 tablet (40 mg total) by mouth daily.    Dispense:  90 tablet    Refill:  3    Please DC lower dose.  Thanks   dapagliflozin propanediol (FARXIGA) 10 MG TABS tablet    Sig: Take 1 tablet (10 mg total) by mouth daily.    Dispense:  90 tablet    Refill:  1   glimepiride (AMARYL) 4 MG tablet    Sig: Take 2 tablets (8 mg total) by mouth daily with breakfast.    Dispense:  180 tablet    Refill:  1   sitaGLIPtin (JANUVIA) 100 MG tablet    Sig: Take 1 tablet (100 mg total) by mouth daily.     Dispense:  90 tablet    Refill:  1   metFORMIN (GLUCOPHAGE) 1000 MG tablet    Sig: 1000 mg BID with food    Dispense:  180 tablet    Refill:  1   Referral Orders  No referral(s) requested today     Note is dictated utilizing voice recognition software. Although note has been proof read prior to signing, occasional typographical errors still can be missed. If any questions arise, please do not hesitate to call for verification.  Electronically signed by: Felix Pacini, DO [facility] [place]

## 2023-05-11 ENCOUNTER — Telehealth: Payer: Self-pay | Admitting: Family Medicine

## 2023-05-11 MED ORDER — FENOFIBRATE 134 MG PO CAPS: 134.0000 mg | ORAL_CAPSULE | Freq: Every day | ORAL | 3 refills | Status: DC

## 2023-05-11 MED ORDER — TIRZEPATIDE 2.5 MG/0.5ML ~~LOC~~ SOAJ
2.5000 mg | SUBCUTANEOUS | 1 refills | Status: DC
Start: 2023-05-11 — End: 2023-08-23

## 2023-05-11 NOTE — Telephone Encounter (Signed)
Please call patient Liver, kidney and thyroid function are normal Blood cell counts and electrolytes are normal Prostate cancer screening is normal  LDL/bad cholesterol is at goal.  Triglycerides were significantly high for a fasting lab.  He is to continue his atorvastatin, but also added a medicine called fenofibrate to his daily regimen.  Diabetes /A1c is extremely high with an A1c of 11.4 and his glucose was 325. 1.  We will need to bring him back in 3 months for provider follow-up-please schedule 2.  We need to start him on Mounjaro, also called tirzepatide, once weekly injections. 3.  Once he is approved for the Mounjaro injections by his insurance, and he picks up the medication he is to discontinue the use of Januvia 100 mg tab. 4.  He is to continue all of his other medications, with the exception of the Januvia. 5.  If needing assistance/tutorial on proper injection technique, he is strongly encouraged to make a nurse appointment for instruction.  His wife had been taking Ozempic, therefore she may be able to instruct him if he does not desire nurse appointment.  Staff-please  make sure he is able to repeat these instructions.  Following instructions has been a barrier in getting his diabetes well-controlled.

## 2023-05-11 NOTE — Telephone Encounter (Signed)
Spoke with patient regarding results/recommendations.  

## 2023-05-20 ENCOUNTER — Ambulatory Visit: Payer: BC Managed Care – PPO | Admitting: Podiatry

## 2023-06-07 ENCOUNTER — Other Ambulatory Visit: Payer: Self-pay | Admitting: Family Medicine

## 2023-06-10 NOTE — Telephone Encounter (Signed)
PA needed for  tirzepatide [facility]) 2.5 MG/0.5ML Pen

## 2023-06-13 ENCOUNTER — Other Ambulatory Visit (HOSPITAL_COMMUNITY): Payer: Self-pay

## 2023-06-13 ENCOUNTER — Telehealth: Payer: Self-pay

## 2023-06-13 NOTE — Telephone Encounter (Signed)
noted 

## 2023-06-13 NOTE — Telephone Encounter (Signed)
Pharmacy Patient Advocate Encounter   Received notification from Physician's Office that prior authorization for Blake Davis is required/requested.   Insurance verification completed.   The patient is insured through [facility] .   Per test claim: PA required; PA submitted to above mentioned insurance via CoverMyMeds Key/confirmation #/EOC Key: [id number] Status is pending and has sent as ''URGENT''

## 2023-06-14 ENCOUNTER — Other Ambulatory Visit (HOSPITAL_COMMUNITY): Payer: Self-pay

## 2023-06-15 ENCOUNTER — Other Ambulatory Visit (HOSPITAL_COMMUNITY): Payer: Self-pay

## 2023-06-15 NOTE — Telephone Encounter (Signed)
Pharmacy Patient Advocate Encounter  Received notification from [facility] that Prior Authorization for Blake Davis has been APPROVED from 11.18.24 to 11.18.25. Ran test claim, Please note that the strength is to be increased every 30 days as tolerated.If not then a Qty exception limit as to be done.    This test claim was processed through [facility]- copay amounts may vary at other pharmacies due to pharmacy/plan contracts, or as the patient moves through the different stages of their insurance plan.   PA #/Case ID/Reference #: Key: [id number]

## 2023-07-05 ENCOUNTER — Other Ambulatory Visit: Payer: Self-pay

## 2023-07-05 MED ORDER — LEVOTHYROXINE SODIUM 50 MCG PO TABS: 50.0000 ug | ORAL_TABLET | Freq: Every day | ORAL | 1 refills | Status: DC

## 2023-08-23 ENCOUNTER — Ambulatory Visit: Payer: BC Managed Care – PPO | Admitting: Family Medicine

## 2023-08-23 ENCOUNTER — Encounter: Payer: Self-pay | Admitting: Family Medicine

## 2023-08-23 VITALS — BP 122/72 | HR 84 | Temp 98.2°F | Wt 230.2 lb

## 2023-08-23 DIAGNOSIS — E118 Type 2 diabetes mellitus with unspecified complications: Secondary | ICD-10-CM | POA: Diagnosis not present

## 2023-08-23 DIAGNOSIS — E1142 Type 2 diabetes mellitus with diabetic polyneuropathy: Secondary | ICD-10-CM

## 2023-08-23 DIAGNOSIS — E039 Hypothyroidism, unspecified: Secondary | ICD-10-CM | POA: Diagnosis not present

## 2023-08-23 DIAGNOSIS — Z7984 Long term (current) use of oral hypoglycemic drugs: Secondary | ICD-10-CM | POA: Diagnosis not present

## 2023-08-23 DIAGNOSIS — E11649 Type 2 diabetes mellitus with hypoglycemia without coma: Secondary | ICD-10-CM | POA: Diagnosis not present

## 2023-08-23 DIAGNOSIS — E782 Mixed hyperlipidemia: Secondary | ICD-10-CM

## 2023-08-23 LAB — POCT GLYCOSYLATED HEMOGLOBIN (HGB A1C)
HbA1c POC (<> result, manual entry): 9 % (ref 4.0–5.6)
HbA1c, POC (controlled diabetic range): 9 % — AB (ref 0.0–7.0)
HbA1c, POC (prediabetic range): 9 % — AB (ref 5.7–6.4)
Hemoglobin A1C: 9 % — AB (ref 4.0–5.6)

## 2023-08-23 MED ORDER — METFORMIN HCL 1000 MG PO TABS: ORAL_TABLET | ORAL | 1 refills | Status: DC

## 2023-08-23 MED ORDER — DAPAGLIFLOZIN PROPANEDIOL 10 MG PO TABS: 10.0000 mg | ORAL_TABLET | Freq: Every day | ORAL | 1 refills | Status: DC

## 2023-08-23 MED ORDER — LEVOTHYROXINE SODIUM 50 MCG PO TABS: 50.0000 ug | ORAL_TABLET | Freq: Every day | ORAL | 1 refills | Status: DC

## 2023-08-23 MED ORDER — TIRZEPATIDE 2.5 MG/0.5ML ~~LOC~~ SOAJ: 2.5000 mg | SUBCUTANEOUS | 1 refills | Status: DC

## 2023-08-23 MED ORDER — GLIMEPIRIDE 4 MG PO TABS: 8.0000 mg | ORAL_TABLET | Freq: Every day | ORAL | 1 refills | Status: DC

## 2023-08-23 NOTE — Patient Instructions (Signed)

## 2023-08-23 NOTE — Progress Notes (Signed)
Patient ID: Blake Davis, male  DOB: [birth year], 48 y.o.   MRN: [id number] Patient Care Team    Relationship Specialty Notifications Start End  Natalia Leatherwood, DO PCP - General Family Medicine  12/02/15   Derenda Mis, Cordelia Poche (Inactive)  Dermatology  02/20/20   Armbruster, Willaim Rayas, MD Consulting Physician Gastroenterology  05/10/23   Helane Gunther, DPM Consulting Physician Podiatry  05/10/23     Chief Complaint  Patient presents with   Diabetes    Subjective: Blake Davis is a 48 y.o. male present for Chronic Conditions/illness Management  All past medical history, surgical history, allergies, family history, immunizations, medications and social history were updated in the electronic medical record today. All recent labs, ED visits and hospitalizations within the last year were reviewed.  Uncontrolled Diabetes type 2 with retinopathy/HLD/obesity Pt reports compliance with metformin 1000 mg BID, Amaryl 4/4 mg BID, farxiga 10 and  still using Januvia. Never started mounjaro d/t travel. Did not est with endo.  Patient denies dizziness, hyperglycemic or hypoglycemic events. Patient denies numbness, tingling in the extremities or nonhealing wounds of feet.  BG not routinely checked.  He has very mild chronic neuropathy.  He is prescribed statin and reports compliance  Hypothyroid: Patient reports compliance with levo 50 mcg      05/10/2023    9:02 AM 07/02/2022    8:46 AM 10/01/2021    8:16 AM 06/11/2020    8:21 AM 11/28/2019    8:15 AM  Depression screen PHQ 2/9  Decreased Interest 0 0 0 0 0  Down, Depressed, Hopeless 0 0 0 0 0  PHQ - 2 Score 0 0 0 0 0       No data to display                 12/29/2018    8:14 AM 05/22/2018    8:33 AM 04/04/2017    8:04 AM  Fall Risk   Falls in the past year? 0 No No  Follow up Falls evaluation completed      Immunization History  Administered Date(s) Administered   Influenza Inj Mdck Quad Pf 05/20/2018    Influenza Split 04/26/2015   Influenza, Seasonal, Injecte, Preservative Fre 05/10/2023   Influenza,inj,Quad PF,6+ Mos 05/30/2017, 08/03/2019, 04/17/2021, 03/18/2022   Influenza,inj,quad, With Preservative 08/05/2019   Influenza-Unspecified 05/31/2016, 04/24/2020   Moderna SARS-COV2 Booster Vaccination 04/04/2021   Moderna Sars-Covid-2 Vaccination 08/06/2019, 09/03/2019, 05/23/2020   Pneumococcal Conjugate-13 03/03/2016   Pneumococcal Polysaccharide-23 04/04/2017   Td 07/27/1999   Tdap 08/04/2017, 09/19/2018    Past Medical History:  Diagnosis Date   Cholestasis 12/2018   per ct for kidney stone   Diabetes mellitus without complication (HCC)    Erectile dysfunction 12/02/2015   Hepatic steatosis 12/2018   Extensive hepatic Seatosis per CT completed for kidney stone June 2020   Ingrowing nail, left great toe    Injury of right foot 09/22/2018   Nephrolithiasis 12/2018   2 mm nonobstructive stone   Pain in joint of left shoulder 09/10/2019   Pain in joint of right shoulder 09/10/2019   Visual changes 10/2015   Diabetic changes   No Known Allergies Past Surgical History:  Procedure Laterality Date   FOOT SURGERY  1988   mole/nevus   NASAL SEPTUM SURGERY  1999   deviated   Family History  Problem Relation Age of Onset   Diabetes Mother    Heart disease Mother    Heart attack Mother  Alcohol abuse Paternal Uncle    Dementia Paternal Grandmother    Diabetes Paternal Grandfather    Stroke Paternal Grandfather    Cancer Paternal Grandfather    Prostate cancer Paternal Grandfather    Social History   Social History Narrative   Patient lives at home with wife (Mel). No children.   Has two pets.    Bachelors degree, works as a Visual merchandiser   Drinks caffeinated beverages   Wears his seatbelt   Smoke detector in home   Feels safe in his relationships    Allergies as of 08/23/2023   No Known Allergies      Medication List        Accurate as of  August 23, 2023  8:45 AM. If you have any questions, ask your nurse or doctor.          atorvastatin 40 MG tablet Commonly known as: LIPITOR Take 1 tablet (40 mg total) by mouth daily.   dapagliflozin propanediol 10 MG Tabs tablet Commonly known as: Farxiga Take 1 tablet (10 mg total) by mouth daily.   fenofibrate micronized 134 MG capsule Commonly known as: LOFIBRA Take 1 capsule (134 mg total) by mouth daily before breakfast.   Fish Oil 1000 MG Caps Take by mouth.   glimepiride 4 MG tablet Commonly known as: AMARYL Take 2 tablets (8 mg total) by mouth daily with breakfast.   levothyroxine 50 MCG tablet Commonly known as: SYNTHROID Take 1 tablet (50 mcg total) by mouth daily before breakfast.   metFORMIN 1000 MG tablet Commonly known as: GLUCOPHAGE 1000 mg BID with food   MULTI COMPLETE PO Take by mouth.   onetouch ultrasoft lancets   OneTouch Verio test strip Generic drug: glucose blood   pimecrolimus 1 % cream Commonly known as: Elidel Apply topically 2 (two) times daily.   tirzepatide 2.5 MG/0.5ML Pen Commonly known as: MOUNJARO Inject 2.5 mg into the skin once a week.       All past medical history, surgical history, allergies, family history, immunizations andmedications were updated in the EMR today and reviewed under the history and medication portions of their EMR.     Recent Results (from the past 2160 hours)  POCT glycosylated hemoglobin (Hb A1C)     Status: Abnormal   Collection Time: 08/23/23  8:29 AM  Result Value Ref Range   Hemoglobin A1C 9.0 (A) 4.0 - 5.6 %   HbA1c POC (<> result, manual entry) 9.0 4.0 - 5.6 %   HbA1c, POC (prediabetic range) 9.0 (A) 5.7 - 6.4 %   HbA1c, POC (controlled diabetic range) 9.0 (A) 0.0 - 7.0 %    No results found.  ROS 14 pt review of systems performed and negative (unless mentioned in an HPI)  Objective: BP 122/72   Pulse 84   Temp 98.2 F (36.8 C)   Wt 230 lb 3.2 oz (104.4 kg)   SpO2 98%   BMI  32.11 kg/m  Physical Exam Vitals and nursing note reviewed.  Constitutional:      General: He is not in acute distress.    Appearance: Normal appearance. He is not ill-appearing, toxic-appearing or diaphoretic.  HENT:     Head: Normocephalic and atraumatic.  Eyes:     General: No scleral icterus.       Right eye: No discharge.        Left eye: No discharge.     Extraocular Movements: Extraocular movements intact.     Pupils: Pupils are  equal, round, and reactive to light.  Cardiovascular:     Rate and Rhythm: Normal rate and regular rhythm.  Pulmonary:     Effort: Pulmonary effort is normal. No respiratory distress.     Breath sounds: Normal breath sounds. No wheezing, rhonchi or rales.  Musculoskeletal:     Right lower leg: No edema.     Left lower leg: No edema.  Skin:    General: Skin is warm.     Findings: No rash.  Neurological:     Mental Status: He is alert and oriented to person, place, and time. Mental status is at baseline.  Psychiatric:        Mood and Affect: Mood normal.        Behavior: Behavior normal.        Thought Content: Thought content normal.        Judgment: Judgment normal.     No results found.  Assessment/plan: Blake Davis is a 48 y.o. male present for Chronic Conditions/illness Management Type 2 diabetes mellitus with complication, without long-term current use of insulin (HCC) Other diabetic neurological complication associated with type 2 diabetes mellitus (HCC) A1c has been trending up the last few visits. Continue metformin 1000 mg BID.  Stop when mounjaro started> januvia 100 QD Continue amaryl to 8 mg total daily- may take as 4 mg BID if desired.  -Patient never started Ozempic prior  visit.  Is willing to start tapering now if A1c warrants. Only used farxiga for 3 months,  We will wait on the results of his A1c today to either  - continue the farxiga 10 mg  QD or Ozempic (again) - continue  statin  - neuropathy stable> pt has  declined gabapentin  PNA series:  completed. 04/04/2017 Flu shot: completed 2024 (recommneded yearly) Microalb: Up-to-date 12/2022 Foot exam: Completed 08/23/2023 Eye exam: 05/2022 Heisler, OD> scheduled. A1c: 7.3--> 6.4---> 7.6--> 7.9--> 7.9--> 8.5--> 10.3--> 6.8--> 7.6 >> 8.7> 6.8>7.1> 7.1>6.7> 7.0 >6.3 >7.2 > 7.8 > 9.5> 7.5>10.5> 11.4 > 9.0 A1c collected today  Morbid obesity (HCC)/Hyperlipidemia Stable Continue  atorvastatin Diet and exercise modifications encouraged  Labs UTD 04/2023  Hypothyroid: Stable Continue levothyroxine 50 mcg Labs UTD 04/2023  Return in about 15 weeks (around 12/06/2023) for Routine chronic condition follow-up.  Orders Placed This Encounter  Procedures   Ambulatory referral to Endocrinology   POCT glycosylated hemoglobin (Hb A1C)   Meds ordered this encounter  Medications   levothyroxine (SYNTHROID) 50 MCG tablet    Sig: Take 1 tablet (50 mcg total) by mouth daily before breakfast.    Dispense:  90 tablet    Refill:  1   metFORMIN (GLUCOPHAGE) 1000 MG tablet    Sig: 1000 mg BID with food    Dispense:  180 tablet    Refill:  1   glimepiride (AMARYL) 4 MG tablet    Sig: Take 2 tablets (8 mg total) by mouth daily with breakfast.    Dispense:  180 tablet    Refill:  1   dapagliflozin propanediol (FARXIGA) 10 MG TABS tablet    Sig: Take 1 tablet (10 mg total) by mouth daily.    Dispense:  90 tablet    Refill:  1   tirzepatide (MOUNJARO) 2.5 MG/0.5ML Pen    Sig: Inject 2.5 mg into the skin once a week.    Dispense:  6 mL    Refill:  1   Referral Orders         Ambulatory referral  to Endocrinology       Note is dictated utilizing voice recognition software. Although note has been proof read prior to signing, occasional typographical errors still can be missed. If any questions arise, please do not hesitate to call for verification.  Electronically signed by: Felix Pacini, DO [facility] [place]

## 2023-10-03 DIAGNOSIS — H2513 Age-related nuclear cataract, bilateral: Secondary | ICD-10-CM | POA: Diagnosis not present

## 2023-10-03 DIAGNOSIS — H43823 Vitreomacular adhesion, bilateral: Secondary | ICD-10-CM | POA: Diagnosis not present

## 2023-10-03 DIAGNOSIS — H35033 Hypertensive retinopathy, bilateral: Secondary | ICD-10-CM | POA: Diagnosis not present

## 2023-10-03 DIAGNOSIS — E113313 Type 2 diabetes mellitus with moderate nonproliferative diabetic retinopathy with macular edema, bilateral: Secondary | ICD-10-CM | POA: Diagnosis not present

## 2023-12-14 ENCOUNTER — Ambulatory Visit: Payer: BC Managed Care – PPO | Admitting: Family Medicine

## 2023-12-14 ENCOUNTER — Other Ambulatory Visit: Payer: Self-pay | Admitting: Family Medicine

## 2023-12-14 ENCOUNTER — Encounter: Payer: Self-pay | Admitting: Family Medicine

## 2023-12-14 VITALS — BP 122/82 | HR 78 | Temp 98.2°F | Wt 232.0 lb

## 2023-12-14 DIAGNOSIS — E118 Type 2 diabetes mellitus with unspecified complications: Secondary | ICD-10-CM

## 2023-12-14 DIAGNOSIS — Z23 Encounter for immunization: Secondary | ICD-10-CM

## 2023-12-14 DIAGNOSIS — Z794 Long term (current) use of insulin: Secondary | ICD-10-CM | POA: Diagnosis not present

## 2023-12-14 DIAGNOSIS — Z7984 Long term (current) use of oral hypoglycemic drugs: Secondary | ICD-10-CM

## 2023-12-14 DIAGNOSIS — E1165 Type 2 diabetes mellitus with hyperglycemia: Secondary | ICD-10-CM

## 2023-12-14 DIAGNOSIS — E039 Hypothyroidism, unspecified: Secondary | ICD-10-CM

## 2023-12-14 DIAGNOSIS — E782 Mixed hyperlipidemia: Secondary | ICD-10-CM

## 2023-12-14 DIAGNOSIS — E1142 Type 2 diabetes mellitus with diabetic polyneuropathy: Secondary | ICD-10-CM

## 2023-12-14 LAB — MICROALBUMIN / CREATININE URINE RATIO
Creatinine,U: 89.5 mg/dL
Microalb Creat Ratio: 8.9 mg/g (ref 0.0–30.0)
Microalb, Ur: 0.8 mg/dL (ref 0.0–1.9)

## 2023-12-14 LAB — POCT GLYCOSYLATED HEMOGLOBIN (HGB A1C)
HbA1c POC (<> result, manual entry): 9.9 % (ref 4.0–5.6)
HbA1c, POC (controlled diabetic range): 9.9 % — AB (ref 0.0–7.0)
HbA1c, POC (prediabetic range): 9.9 % — AB (ref 5.7–6.4)
Hemoglobin A1C: 9.9 % — AB (ref 4.0–5.6)

## 2023-12-14 MED ORDER — FREESTYLE LIBRE 3 PLUS SENSOR MISC: 5 refills | Status: DC

## 2023-12-14 MED ORDER — SITAGLIPTIN PHOSPHATE 100 MG PO TABS: 100.0000 mg | ORAL_TABLET | Freq: Every day | ORAL | 1 refills | Status: DC

## 2023-12-14 MED ORDER — DAPAGLIFLOZIN PROPANEDIOL 10 MG PO TABS: 10.0000 mg | ORAL_TABLET | Freq: Every day | ORAL | 1 refills | Status: DC

## 2023-12-14 MED ORDER — METFORMIN HCL 1000 MG PO TABS: ORAL_TABLET | ORAL | 1 refills | Status: DC

## 2023-12-14 MED ORDER — LANTUS SOLOSTAR 100 UNIT/ML ~~LOC~~ SOPN: 20.0000 [IU] | PEN_INJECTOR | Freq: Every day | SUBCUTANEOUS | 2 refills | Status: DC

## 2023-12-14 MED ORDER — ATORVASTATIN CALCIUM 40 MG PO TABS: 40.0000 mg | ORAL_TABLET | Freq: Every day | ORAL | 3 refills | Status: AC

## 2023-12-14 NOTE — Patient Instructions (Addendum)
 Stop glimperide, start lantus 20 units in the morning.   Monitor sugars fasting in the morning, if above 120> then increase lantus dose by 2 units> do this daily until you see fasting glucose of 120. The dose of lantus you see glucose of 120 is the dose you will stay on.   Return in about 12 weeks (around 03/07/2024) for Routine chronic condition follow-up.        Great to see you today.  I have refilled the medication(s) we provide.   If labs were collected or images ordered, we will inform you of  results once we have received them and reviewed. We will contact you either by echart message, or telephone call.  Please give ample time to the testing facility, and our office to run,  receive and review results. Please do not call inquiring of results, even if you can see them in your chart. We will contact you as soon as we are able. If it has been over 1 week since the test was completed, and you have not yet heard from us , then please call us .    - echart message- for normal results that have been seen by the patient already.   - telephone call: abnormal results or if patient has not viewed results in their echart.  If a referral to a specialist was entered for you, please call us  in 2 weeks if you have not heard from the specialist office to schedule.

## 2023-12-14 NOTE — Progress Notes (Signed)
 Patient ID: Blake Davis, male  DOB: 01-23-1976, 48 y.o.   MRN: [id number] Patient Care Team    Relationship Specialty Notifications Start End  Mariel Shope, DO PCP - General Family Medicine  12/02/15   Belita Bowling, Kirby Peoples (Inactive)  Dermatology  02/20/20   Armbruster, Lendon Queen, MD Consulting Physician Gastroenterology  05/10/23   Ruffin Cotton, DPM Consulting Physician Podiatry  05/10/23     Chief Complaint  Patient presents with   Diabetes    Pt is fasting.     Subjective: Blake Davis is a 48 y.o. male present for Chronic Conditions/illness Management  All past medical history, surgical history, allergies, family history, immunizations, medications and social history were updated in the electronic medical record today. All recent labs, ED visits and hospitalizations within the last year were reviewed.  Uncontrolled Diabetes type 2 with retinopathy/HLD/obesity Pt reports compliance with metformin  1000 mg BID, Amaryl  4/4 mg BID, farxiga  10 and januvia . Never started mounjaro  Patient denies dizziness, hyperglycemic or hypoglycemic events. Patient denies numbness, tingling in the extremities or nonhealing wounds of feet.  BG not routinely checked.  He has very mild chronic neuropathy.  He is prescribed statin and reports compliance  Hypothyroid: Patient reports compliance with levo 50 mcg      05/10/2023    9:02 AM 07/02/2022    8:46 AM 10/01/2021    8:16 AM 06/11/2020    8:21 AM 11/28/2019    8:15 AM  Depression screen PHQ 2/9  Decreased Interest 0 0 0 0 0  Down, Depressed, Hopeless 0 0 0 0 0  PHQ - 2 Score 0 0 0 0 0       No data to display                 12/29/2018    8:14 AM 05/22/2018    8:33 AM 04/04/2017    8:04 AM  Fall Risk   Falls in the past year? 0 No No  Follow up Falls evaluation completed      Immunization History  Administered Date(s) Administered   Influenza Inj Mdck Quad Pf 05/20/2018   Influenza Split 04/26/2015    Influenza, Seasonal, Injecte, Preservative Fre 05/10/2023   Influenza,inj,Quad PF,6+ Mos 05/30/2017, 08/03/2019, 04/17/2021, 03/18/2022   Influenza,inj,quad, With Preservative 08/05/2019   Influenza-Unspecified 05/31/2016, 04/24/2020   Moderna SARS-COV2 Booster Vaccination 04/04/2021   Moderna Sars-Covid-2 Vaccination 08/06/2019, 09/03/2019, 05/23/2020   PFIZER Comirnaty(Gray Top)Covid-19 Tri-Sucrose Vaccine 06/25/2023   PNEUMOCOCCAL CONJUGATE-20 12/14/2023   Pneumococcal Conjugate-13 03/03/2016   Pneumococcal Polysaccharide-23 04/04/2017   Td 07/27/1999   Tdap 08/04/2017, 09/19/2018    Past Medical History:  Diagnosis Date   Cholestasis 12/2018   per ct for kidney stone   Diabetes mellitus without complication (HCC)    Erectile dysfunction 12/02/2015   Hepatic steatosis 12/2018   Extensive hepatic Seatosis per CT completed for kidney stone June 2020   Ingrowing nail, left great toe    Injury of right foot 09/22/2018   Nephrolithiasis 12/2018   2 mm nonobstructive stone   Pain in joint of left shoulder 09/10/2019   Pain in joint of right shoulder 09/10/2019   Visual changes 10/2015   Diabetic changes   No Known Allergies Past Surgical History:  Procedure Laterality Date   FOOT SURGERY  1988   mole/nevus   NASAL SEPTUM SURGERY  1999   deviated   Family History  Problem Relation Age of Onset   Diabetes Mother  Heart disease Mother    Heart attack Mother    Alcohol abuse Paternal Uncle    Dementia Paternal Grandmother    Diabetes Paternal Grandfather    Stroke Paternal Grandfather    Cancer Paternal Grandfather    Prostate cancer Paternal Grandfather    Social History   Social History Narrative   Patient lives at home with wife (Mel). No children.   Has two pets.    Bachelors degree, works as a Visual merchandiser   Drinks caffeinated beverages   Wears his seatbelt   Smoke detector in home   Feels safe in his relationships    Allergies as of 12/14/2023    No Known Allergies      Medication List        Accurate as of Dec 14, 2023  1:06 PM. If you have any questions, ask your nurse or doctor.          STOP taking these medications    Comirnaty syringe Generic drug: COVID-19 mRNA vaccine Proofreader) Stopped by: Napolean Backbone   glimepiride  4 MG tablet Commonly known as: AMARYL  Stopped by: Yochanan Eddleman   tirzepatide  2.5 MG/0.5ML Pen Commonly known as: MOUNJARO  Stopped by: Napolean Backbone       TAKE these medications    atorvastatin  40 MG tablet Commonly known as: LIPITOR Take 1 tablet (40 mg total) by mouth daily.   dapagliflozin  propanediol 10 MG Tabs tablet Commonly known as: Farxiga  Take 1 tablet (10 mg total) by mouth daily.   fenofibrate  micronized 134 MG capsule Commonly known as: LOFIBRA Take 1 capsule (134 mg total) by mouth daily before breakfast.   Fish Oil 1000 MG Caps Take by mouth.   FreeStyle Libre 3 Plus Sensor Misc Change sensor every 15 days. Monitor glucose fasting in the morning and before meals Started by: Napolean Backbone   Lantus SoloStar 100 UNIT/ML Solostar Pen Generic drug: insulin glargine Inject 20 Units into the skin daily. Started by: Hesston Hitchens   levothyroxine  50 MCG tablet Commonly known as: SYNTHROID  Take 1 tablet (50 mcg total) by mouth daily before breakfast.   metFORMIN  1000 MG tablet Commonly known as: GLUCOPHAGE  1000 mg BID with food   MULTI COMPLETE PO Take by mouth.   onetouch ultrasoft lancets   OneTouch Verio test strip Generic drug: glucose blood   pimecrolimus  1 % cream Commonly known as: Elidel  Apply topically 2 (two) times daily.   sitaGLIPtin  100 MG tablet Commonly known as: Januvia  Take 1 tablet (100 mg total) by mouth daily. Started by: Napolean Backbone       All past medical history, surgical history, allergies, family history, immunizations andmedications were updated in the EMR today and reviewed under the history and medication portions of their  EMR.     Recent Results (from the past 2160 hours)  POCT HgB A1C     Status: Abnormal   Collection Time: 12/14/23  8:44 AM  Result Value Ref Range   Hemoglobin A1C 9.9 (A) 4.0 - 5.6 %   HbA1c POC (<> result, manual entry) 9.9 4.0 - 5.6 %   HbA1c, POC (prediabetic range) 9.9 (A) 5.7 - 6.4 %   HbA1c, POC (controlled diabetic range) 9.9 (A) 0.0 - 7.0 %     No results found.  ROS 14 pt review of systems performed and negative (unless mentioned in an HPI)  Objective: BP 122/82   Pulse 78   Temp 98.2 F (36.8 C)   Wt 232 lb (105.2 kg)  SpO2 98%   BMI 32.36 kg/m  Physical Exam Vitals and nursing note reviewed.  Constitutional:      General: He is not in acute distress.    Appearance: Normal appearance. He is not ill-appearing, toxic-appearing or diaphoretic.  HENT:     Head: Normocephalic and atraumatic.  Eyes:     General: No scleral icterus.       Right eye: No discharge.        Left eye: No discharge.     Extraocular Movements: Extraocular movements intact.     Pupils: Pupils are equal, round, and reactive to light.  Cardiovascular:     Rate and Rhythm: Normal rate and regular rhythm.  Pulmonary:     Effort: Pulmonary effort is normal. No respiratory distress.     Breath sounds: Normal breath sounds. No wheezing, rhonchi or rales.  Musculoskeletal:     Right lower leg: No edema.     Left lower leg: No edema.  Skin:    General: Skin is warm.     Findings: No rash.  Neurological:     Mental Status: He is alert and oriented to person, place, and time. Mental status is at baseline.  Psychiatric:        Mood and Affect: Mood normal.        Behavior: Behavior normal.        Thought Content: Thought content normal.        Judgment: Judgment normal.     No results found.  Assessment/plan: Blake Davis is a 48 y.o. male present for Chronic Conditions/illness Management Type 2 diabetes mellitus with complication, without long-term current use of insulin  (HCC) Other diabetic neurological complication associated with type 2 diabetes mellitus (HCC) A1c has been trending up the last few visits. Continue metformin  1000 mg BID.  Monitor sugars fasting in the morning, if above 120> then increase lantus dose by 2 units> do this daily until  fasting glucose of 120. The dose of lantus you see glucose of 120 is the dose you will stay on.  STOP amaryl  to 8 mg total daily- may take as 4 mg BID if desired.  Continue the farxiga  10 mg  QD or Ozempic  (again) Continue statin  - neuropathy stable> pt has declined gabapentin  PNA series:  completed. 04/04/2017 Flu shot: completed 2024 (recommneded yearly) Microalb: Collected 12/14/2023 Foot exam: Completed 08/23/2023 Eye exam: 08/2023 Heisler, OD A1c: 7.3--> 6.4---> 7.6--> 7.9--> 7.9--> 8.5--> 10.3--> 6.8--> 7.6 >> 8.7> 6.8>7.1> 7.1>6.7> 7.0 >6.3 >7.2 > 7.8 > 9.5> 7.5>10.5> 11.4 > 9.0> 9.9 A1c collected today  Morbid obesity (HCC)/Hyperlipidemia Stable Continue atorvastatin  Diet and exercise modifications encouraged  Labs UTD 04/2023  Hypothyroid: Stable Continue levothyroxine  50 mcg Labs UTD 04/2023  Return in about 12 weeks (around 03/07/2024) for Routine chronic condition follow-up.  Orders Placed This Encounter  Procedures   Pneumococcal conjugate vaccine 20-valent   Urine Microalbumin w/creat. ratio   POCT HgB A1C   Meds ordered this encounter  Medications   metFORMIN  (GLUCOPHAGE ) 1000 MG tablet    Sig: 1000 mg BID with food    Dispense:  180 tablet    Refill:  1   atorvastatin  (LIPITOR) 40 MG tablet    Sig: Take 1 tablet (40 mg total) by mouth daily.    Dispense:  90 tablet    Refill:  3    Please DC lower dose.  Thanks   insulin glargine (LANTUS SOLOSTAR) 100 UNIT/ML Solostar Pen  Sig: Inject 20 Units into the skin daily.    Dispense:  15 mL    Refill:  2   dapagliflozin  propanediol (FARXIGA ) 10 MG TABS tablet    Sig: Take 1 tablet (10 mg total) by mouth daily.    Dispense:  90  tablet    Refill:  1   sitaGLIPtin  (JANUVIA ) 100 MG tablet    Sig: Take 1 tablet (100 mg total) by mouth daily.    Dispense:  90 tablet    Refill:  1   Continuous Glucose Sensor (FREESTYLE LIBRE 3 PLUS SENSOR) MISC    Sig: Change sensor every 15 days. Monitor glucose fasting in the morning and before meals    Dispense:  2 each    Refill:  5   Referral Orders  No referral(s) requested today      Note is dictated utilizing voice recognition software. Although note has been proof read prior to signing, occasional typographical errors still can be missed. If any questions arise, please do not hesitate to call for verification.  Electronically signed by: Napolean Backbone, DO [facility] [place]

## 2023-12-15 ENCOUNTER — Ambulatory Visit: Payer: Self-pay | Admitting: Family Medicine

## 2023-12-16 ENCOUNTER — Other Ambulatory Visit (HOSPITAL_COMMUNITY): Payer: Self-pay

## 2023-12-16 ENCOUNTER — Telehealth: Payer: Self-pay

## 2023-12-16 NOTE — Telephone Encounter (Signed)
 Pharmacy Patient Advocate Encounter  Insurance verification completed.   The patient is insured through [facility]   Ran test claim for (LANTUS SOLOSTAR) 100 UNIT/ML Solostar Pen . Currently a quantity of 15 ml is a 75 day supply and the co-pay is 90.25 The current 75 day co-pay is, $90.25.  No PA needed at this time. PLEASE SEE TEST [employer] verification completed.   The patient is insured through [facility]   Ran test claim for SEMGLEE 100 UNITS/ML PEN. Currently a quantity of 15 is a 75 day supply and the co-pay is 105.00 . The current 75 day co-pay is, $105.00.  No PA needed at this time. PLEASE SEE TEST CLAIM   This test claim was processed through [facility]- copay amounts may vary at other pharmacies due to pharmacy/plan contracts, or as the patient moves through the different stages of their insurance plan.

## 2023-12-16 NOTE — Telephone Encounter (Signed)
 Noted

## 2023-12-16 NOTE — Telephone Encounter (Signed)
 Pharmacy Patient Advocate Encounter  Insurance verification completed.   The patient is insured through [employer]   Ran test claim for FREESTYLE LIBRE 3 PLUS SENSOR) . Currently a quantity of 2 each boxes is a 30 day supply and the co-pay is 74.99 . The current 30 day co-pay is, $74.99.  No PA needed at this time. PLEASE SEE TEST CLAIM BELOW.  This test claim was processed through [facility]- copay amounts may vary at other pharmacies due to pharmacy/plan contracts, or as the patient moves through the different stages of their insurance plan.

## 2023-12-23 ENCOUNTER — Other Ambulatory Visit (HOSPITAL_COMMUNITY): Payer: Self-pay

## 2023-12-28 NOTE — Telephone Encounter (Signed)
 Request completed, unable to remove request from our queue.

## 2023-12-28 NOTE — Telephone Encounter (Signed)
 PA not needed-unable to complete request on our end to remove it from the queue

## 2024-02-13 DIAGNOSIS — H35033 Hypertensive retinopathy, bilateral: Secondary | ICD-10-CM | POA: Diagnosis not present

## 2024-02-13 DIAGNOSIS — H43823 Vitreomacular adhesion, bilateral: Secondary | ICD-10-CM | POA: Diagnosis not present

## 2024-02-13 DIAGNOSIS — H2513 Age-related nuclear cataract, bilateral: Secondary | ICD-10-CM | POA: Diagnosis not present

## 2024-02-13 DIAGNOSIS — E113313 Type 2 diabetes mellitus with moderate nonproliferative diabetic retinopathy with macular edema, bilateral: Secondary | ICD-10-CM | POA: Diagnosis not present

## 2024-03-07 ENCOUNTER — Ambulatory Visit: Admitting: Family Medicine

## 2024-03-27 ENCOUNTER — Ambulatory Visit: Admitting: Family Medicine

## 2024-03-28 ENCOUNTER — Ambulatory Visit: Admitting: Family Medicine

## 2024-03-30 ENCOUNTER — Encounter: Payer: Self-pay | Admitting: Family Medicine

## 2024-03-30 ENCOUNTER — Ambulatory Visit: Admitting: Family Medicine

## 2024-03-30 VITALS — BP 124/78 | HR 83 | Temp 98.3°F | Wt 231.0 lb

## 2024-03-30 DIAGNOSIS — E118 Type 2 diabetes mellitus with unspecified complications: Secondary | ICD-10-CM

## 2024-03-30 DIAGNOSIS — E1142 Type 2 diabetes mellitus with diabetic polyneuropathy: Secondary | ICD-10-CM

## 2024-03-30 DIAGNOSIS — E782 Mixed hyperlipidemia: Secondary | ICD-10-CM | POA: Diagnosis not present

## 2024-03-30 DIAGNOSIS — Z794 Long term (current) use of insulin: Secondary | ICD-10-CM

## 2024-03-30 DIAGNOSIS — E1165 Type 2 diabetes mellitus with hyperglycemia: Secondary | ICD-10-CM | POA: Diagnosis not present

## 2024-03-30 DIAGNOSIS — E039 Hypothyroidism, unspecified: Secondary | ICD-10-CM | POA: Diagnosis not present

## 2024-03-30 DIAGNOSIS — Z23 Encounter for immunization: Secondary | ICD-10-CM | POA: Diagnosis not present

## 2024-03-30 DIAGNOSIS — Z7984 Long term (current) use of oral hypoglycemic drugs: Secondary | ICD-10-CM

## 2024-03-30 DIAGNOSIS — Z1211 Encounter for screening for malignant neoplasm of colon: Secondary | ICD-10-CM

## 2024-03-30 DIAGNOSIS — L84 Corns and callosities: Secondary | ICD-10-CM

## 2024-03-30 LAB — CBC
HCT: 46.1 % (ref 39.0–52.0)
Hemoglobin: 15.4 g/dL (ref 13.0–17.0)
MCHC: 33.5 g/dL (ref 30.0–36.0)
MCV: 86.3 fl (ref 78.0–100.0)
Platelets: 216 K/uL (ref 150.0–400.0)
RBC: 5.34 Mil/uL (ref 4.22–5.81)
RDW: 13.4 % (ref 11.5–15.5)
WBC: 6.7 K/uL (ref 4.0–10.5)

## 2024-03-30 LAB — COMPREHENSIVE METABOLIC PANEL WITH GFR
ALT: 70 U/L — ABNORMAL HIGH (ref 0–53)
AST: 32 U/L (ref 0–37)
Albumin: 4.6 g/dL (ref 3.5–5.2)
Alkaline Phosphatase: 49 U/L (ref 39–117)
BUN: 16 mg/dL (ref 6–23)
CO2: 27 meq/L (ref 19–32)
Calcium: 9.4 mg/dL (ref 8.4–10.5)
Chloride: 97 meq/L (ref 96–112)
Creatinine, Ser: 1.11 mg/dL (ref 0.40–1.50)
GFR: 77.67 mL/min (ref >60.00)
Glucose, Bld: 386 mg/dL — ABNORMAL HIGH (ref 70–99)
Potassium: 4.7 meq/L (ref 3.5–5.1)
Sodium: 137 meq/L (ref 135–145)
Total Bilirubin: 0.6 mg/dL (ref 0.2–1.2)
Total Protein: 7.5 g/dL (ref 6.0–8.3)

## 2024-03-30 LAB — POCT GLYCOSYLATED HEMOGLOBIN (HGB A1C)
HbA1c POC (<> result, manual entry): 10.9 % (ref 4.0–5.6)
HbA1c, POC (controlled diabetic range): 10.9 % — AB (ref 0.0–7.0)
HbA1c, POC (prediabetic range): 10.9 % — AB (ref 5.7–6.4)
Hemoglobin A1C: 10.9 % — AB (ref 4.0–5.6)

## 2024-03-30 LAB — LDL CHOLESTEROL, DIRECT: Direct LDL: 79 mg/dL

## 2024-03-30 LAB — TSH: TSH: 3.49 u[IU]/mL (ref 0.35–5.50)

## 2024-03-30 MED ORDER — METFORMIN HCL 1000 MG PO TABS: ORAL_TABLET | ORAL | 1 refills | Status: AC

## 2024-03-30 MED ORDER — LANTUS SOLOSTAR 100 UNIT/ML ~~LOC~~ SOPN: 20.0000 [IU] | PEN_INJECTOR | Freq: Every day | SUBCUTANEOUS | 2 refills | Status: AC

## 2024-03-30 MED ORDER — FREESTYLE LIBRE 3 PLUS SENSOR MISC: 5 refills | Status: AC

## 2024-03-30 MED ORDER — FENOFIBRATE 134 MG PO CAPS: 134.0000 mg | ORAL_CAPSULE | Freq: Every day | ORAL | 3 refills | Status: AC

## 2024-03-30 MED ORDER — DAPAGLIFLOZIN PROPANEDIOL 10 MG PO TABS: 10.0000 mg | ORAL_TABLET | Freq: Every day | ORAL | 1 refills | Status: AC

## 2024-03-30 NOTE — Progress Notes (Signed)
 Patient ID: Blake Davis, male  DOB: 1975/10/24, 48 y.o.   MRN: [id number] Patient Care Team    Relationship Specialty Notifications Start End  Catherine Charlies LABOR, DO PCP - General Family Medicine  12/02/15   Connee Nest, DEVONNA (Inactive)  Dermatology  02/20/20   Armbruster, Elspeth SQUIBB, MD Consulting Physician Gastroenterology  05/10/23   Loreda Hacker, DPM Consulting Physician Podiatry  05/10/23     Chief Complaint  Patient presents with   Hypertension   Diabetes    Pt is fasting.    Subjective: Blake Davis is a 48 y.o. male present for Chronic Conditions/illness Management  All past medical history, surgical history, allergies, family history, immunizations, medications and social history were updated in the electronic medical record today. All recent labs, ED visits and hospitalizations within the last year were reviewed.  Uncontrolled Diabetes type 2 with retinopathy/HLD/obesity Pt reports compliance with metformin  1000 mg BID,  farxiga  10? (Pharm report suggest not taking ).  Lantus  20 units started last visit with tapering instructions, patient pick up medication.  He states he was busy traveling over the last 3 months.   Patient denies dizziness, hyperglycemic or hypoglycemic events. Patient denies numbness, tingling in the extremities or nonhealing wounds of feet.  BG not routinely checked.  He has very mild chronic neuropathy.  He is prescribed statin and reports compliance  Hypothyroid: Patient reports compliance with levo 50 mcg      03/30/2024   10:24 AM 05/10/2023    9:02 AM 07/02/2022    8:46 AM 10/01/2021    8:16 AM 06/11/2020    8:21 AM  Depression screen PHQ 2/9  Decreased Interest 0 0 0 0 0  Down, Depressed, Hopeless 0 0 0 0 0  PHQ - 2 Score 0 0 0 0 0  Altered sleeping 0      Tired, decreased energy 0      Change in appetite 0      Feeling bad or failure about yourself  0      Trouble concentrating 0      Moving slowly or  fidgety/restless 0      Suicidal thoughts 0      PHQ-9 Score 0      Difficult doing work/chores Not difficult at all          03/30/2024   10:24 AM  GAD 7 : Generalized Anxiety Score  Nervous, Anxious, on Edge 0  Control/stop worrying 0  Worry too much - different things 0  Trouble relaxing 0  Restless 0  Easily annoyed or irritable 0  Afraid - awful might happen 0  Total GAD 7 Score 0  Anxiety Difficulty Not difficult at all           03/30/2024   10:24 AM 12/29/2018    8:14 AM 05/22/2018    8:33 AM 04/04/2017    8:04 AM  Fall Risk   Falls in the past year? 0 0  No  No   Number falls in past yr: 0     Injury with Fall? 0     Follow up Falls evaluation completed Falls evaluation completed        Data saved with a previous flowsheet row definition    Immunization History  Administered Date(s) Administered   Influenza Inj Mdck Quad Pf 05/20/2018   Influenza Split 04/26/2015   Influenza, Seasonal, Injecte, Preservative Fre 05/10/2023, 03/30/2024   Influenza,inj,Quad PF,6+ Mos 05/30/2017, 08/03/2019, 04/17/2021, 03/18/2022  Influenza,inj,quad, With Preservative 08/05/2019   Influenza-Unspecified 05/31/2016, 04/24/2020   Moderna SARS-COV2 Booster Vaccination 04/04/2021   Moderna Sars-Covid-2 Vaccination 08/06/2019, 09/03/2019, 05/23/2020   PFIZER Comirnaty(Gray Top)Covid-19 Tri-Sucrose Vaccine 06/25/2023   PNEUMOCOCCAL CONJUGATE-20 12/14/2023   Pneumococcal Conjugate-13 03/03/2016   Pneumococcal Polysaccharide-23 04/04/2017   Td 07/27/1999   Tdap 08/04/2017, 09/19/2018    Past Medical History:  Diagnosis Date   Cholestasis 12/2018   per ct for kidney stone   Diabetes mellitus without complication (HCC)    Erectile dysfunction 12/02/2015   Hepatic steatosis 12/2018   Extensive hepatic Seatosis per CT completed for kidney stone June 2020   Ingrowing nail, left great toe    Injury of right foot 09/22/2018   Nephrolithiasis 12/2018   2 mm nonobstructive stone    Pain in joint of left shoulder 09/10/2019   Pain in joint of right shoulder 09/10/2019   Visual changes 10/2015   Diabetic changes   No Known Allergies Past Surgical History:  Procedure Laterality Date   FOOT SURGERY  1988   mole/nevus   NASAL SEPTUM SURGERY  1999   deviated   Family History  Problem Relation Age of Onset   Diabetes Mother    Heart disease Mother    Heart attack Mother    Alcohol abuse Paternal Uncle    Dementia Paternal Grandmother    Diabetes Paternal Grandfather    Stroke Paternal Grandfather    Cancer Paternal Grandfather    Prostate cancer Paternal Grandfather    Social History   Social History Narrative   Patient lives at home with wife (Mel). No children.   Has two pets.    Bachelors degree, works as a Visual merchandiser   Drinks caffeinated beverages   Wears his seatbelt   Smoke detector in home   Feels safe in his relationships    Allergies as of 03/30/2024   No Known Allergies      Medication List        Accurate as of March 30, 2024  2:25 PM. If you have any questions, ask your nurse or doctor.          STOP taking these medications    insulin glargine -yfgn 100 UNIT/ML Pen Commonly known as: Semglee (yfgn) Stopped by: Charlies Bellini   onetouch ultrasoft lancets Stopped by: Charlies Bellini   OneTouch Verio test strip Generic drug: glucose blood Stopped by: Charlies Bellini   sitaGLIPtin  100 MG tablet Commonly known as: Januvia  Stopped by: Charlies Bellini       TAKE these medications    atorvastatin  40 MG tablet Commonly known as: LIPITOR Take 1 tablet (40 mg total) by mouth daily.   dapagliflozin  propanediol 10 MG Tabs tablet Commonly known as: Farxiga  Take 1 tablet (10 mg total) by mouth daily.   fenofibrate  micronized 134 MG capsule Commonly known as: LOFIBRA Take 1 capsule (134 mg total) by mouth daily before breakfast.   Fish Oil 1000 MG Caps Take by mouth.   FreeStyle Libre 3 Plus Sensor Misc Change  sensor every 15 days. Monitor glucose fasting in the morning and before meals   Lantus  SoloStar 100 UNIT/ML Solostar Pen Generic drug: insulin glargine  Inject 20-25 Units into the skin daily.   levothyroxine  50 MCG tablet Commonly known as: SYNTHROID  Take 1 tablet (50 mcg total) by mouth daily before breakfast.   metFORMIN  1000 MG tablet Commonly known as: GLUCOPHAGE  1000 mg BID with food   MULTI COMPLETE PO Take by mouth.   pimecrolimus  1 %  cream Commonly known as: Elidel  Apply topically 2 (two) times daily.       All past medical history, surgical history, allergies, family history, immunizations andmedications were updated in the EMR today and reviewed under the history and medication portions of their EMR.     Recent Results (from the past 2160 hours)  POCT HgB A1C     Status: Abnormal   Collection Time: 03/30/24  9:56 AM  Result Value Ref Range   Hemoglobin A1C 10.9 (A) 4.0 - 5.6 %   HbA1c POC (<> result, manual entry) 10.9 4.0 - 5.6 %   HbA1c, POC (prediabetic range) 10.9 (A) 5.7 - 6.4 %   HbA1c, POC (controlled diabetic range) 10.9 (A) 0.0 - 7.0 %      No results found.  ROS 14 pt review of systems performed and negative (unless mentioned in an HPI)  Objective: BP 124/78   Pulse 83   Temp 98.3 F (36.8 C)   Wt 231 lb (104.8 kg)   SpO2 97%   BMI 32.22 kg/m  Physical Exam Vitals and nursing note reviewed.  Constitutional:      General: He is not in acute distress.    Appearance: Normal appearance. He is not ill-appearing, toxic-appearing or diaphoretic.  HENT:     Head: Normocephalic and atraumatic.  Eyes:     General: No scleral icterus.       Right eye: No discharge.        Left eye: No discharge.     Extraocular Movements: Extraocular movements intact.     Pupils: Pupils are equal, round, and reactive to light.  Cardiovascular:     Rate and Rhythm: Normal rate and regular rhythm.     Heart sounds: No murmur heard. Pulmonary:     Effort:  Pulmonary effort is normal. No respiratory distress.     Breath sounds: Normal breath sounds. No wheezing, rhonchi or rales.  Musculoskeletal:     Right lower leg: No edema.     Left lower leg: No edema.  Skin:    General: Skin is warm.     Findings: No rash.  Neurological:     Mental Status: He is alert and oriented to person, place, and time. Mental status is at baseline.  Psychiatric:        Mood and Affect: Mood normal.        Behavior: Behavior normal.        Thought Content: Thought content normal.        Judgment: Judgment normal.    Diabetic Foot Exam - Simple   Simple Foot Form Diabetic Foot exam was performed with the following findings: Yes 03/30/2024  2:24 PM  Visual Inspection Sensation Testing Intact to touch and monofilament testing bilaterally: Yes Pulse Check Posterior Tibialis and Dorsalis pulse intact bilaterally: Yes Comments Thick callus formation plantar aspect of foot first metatarsal head.  No erythema or drainage.     No results found.  Assessment/plan: Blake Davis is a 48 y.o. male present for Chronic Conditions/illness Management Type 2 diabetes mellitus with complication, without long-term current use of insulin (HCC) Other diabetic neurological complication associated with type 2 diabetes mellitus (HCC) A1c has been trending up the last few visits. continue metformin  1000 mg BID.  Lantus  20 start>Monitor sugars fasting in the morning, if above 120> then increase lantus  dose by 2 units> do this daily until  fasting glucose of 120. The dose of lantus  you see glucose of 120 is the  dose you will stay on.  Continue the farxiga  10 mg  QD (pharm report suggest he does not routinely take this med) Continue statin  - neuropathy stable> pt has declined gabapentin  PNA series:  completed. 04/04/2017 Flu shot: administered today (recommneded yearly) Microalb: Collected 12/14/2023 Foot exam: Completed 03/30/2024- callus formation foot.  Eye exam: 08/2023  Heisler, OD A1c: 7.3--> 6.4---> 7.6--> 7.9--> 7.9--> 8.5--> 10.3--> 6.8--> 7.6 >> 8.7> 6.8>7.1> 7.1>6.7> 7.0 >6.3 >7.2 > 7.8 > 9.5> 7.5>10.5> 11.4 > 9.0> 9.9 > 10.9 A1c collected today Referral to endocrine placed today  Morbid obesity (HCC)/Hyperlipidemia Continue  atorvastatin  Diet and exercise modifications encouraged  Cbc, cmp and Lipid collected  Hypothyroid: Stable Continue  levothyroxine  50 mcg. refills will be provided in appropriate dose based on lab result today Tsh collected  Return in about 3 months (around 07/02/2024). (ONLY need this if endocrine as not seen you yet to establish) Otherwise yearly appt.  Orders Placed This Encounter  Procedures   Flu vaccine trivalent PF, 6mos and older(Flulaval,Afluria,Fluarix,Fluzone)   Comp Met (CMET)   CBC   TSH   Direct LDL   Cologuard   Ambulatory referral to Endocrinology   Ambulatory referral to Podiatry   POCT HgB A1C   Meds ordered this encounter  Medications   fenofibrate  micronized (LOFIBRA) 134 MG capsule    Sig: Take 1 capsule (134 mg total) by mouth daily before breakfast.    Dispense:  90 capsule    Refill:  3   LANTUS  SOLOSTAR 100 UNIT/ML Solostar Pen    Sig: Inject 20-25 Units into the skin daily.    Dispense:  15 mL    Refill:  2   metFORMIN  (GLUCOPHAGE ) 1000 MG tablet    Sig: 1000 mg BID with food    Dispense:  180 tablet    Refill:  1   dapagliflozin  propanediol (FARXIGA ) 10 MG TABS tablet    Sig: Take 1 tablet (10 mg total) by mouth daily.    Dispense:  90 tablet    Refill:  1   Continuous Glucose Sensor (FREESTYLE LIBRE 3 PLUS SENSOR) MISC    Sig: Change sensor every 15 days. Monitor glucose fasting in the morning and before meals    Dispense:  2 each    Refill:  5   Referral Orders         Ambulatory referral to Endocrinology         Ambulatory referral to Podiatry        Note is dictated utilizing voice recognition software. Although note has been proof read prior to signing, occasional  typographical errors still can be missed. If any questions arise, please do not hesitate to call for verification.  Electronically signed by: Charlies Bellini, DO [facility] [place]

## 2024-03-30 NOTE — Patient Instructions (Addendum)
 Return in about 3 months (around 07/02/2024). (ONLY need this if endocrine as not seen you yet to establish)  Take the farxiga  and metformin  daily.  Start lantus /insulin 20 units nightly Monitor sugars fasting in the morning, if above 120> then increase lantus  dose by 2 units> do this daily until  fasting glucose of 120. The dose of lantus  you see glucose of 120 is the dose you will stay on.  Call your podiatrist ASAP to treat your callus.   Team Member Role and Specialty Contact Info Address Start End Comments  Loreda Hacker, DPM Consulting Physician (Podiatry) Phone: [phone] Fax: [phone] Email: [email or web address] [address] [place] [address] 05/10/2023 - -     Establish with endocrinology to manage diabetes moving forward. You will receive a call or echart message to get you scheduled.       Great to see you today.  I have refilled the medication(s) we provide.   If labs were collected or images ordered, we will inform you of  results once we have received them and reviewed. We will contact you either by echart message, or telephone call.  Please give ample time to the testing facility, and our office to run,  receive and review results. Please do not call inquiring of results, even if you can see them in your chart. We will contact you as soon as we are able. If it has been over 1 week since the test was completed, and you have not yet heard from us , then please call us .    - echart message- for normal results that have been seen by the patient already.   - telephone call: abnormal results or if patient has not viewed results in their echart.  If a referral to a specialist was entered for you, please call us  in 2 weeks if you have not heard from the specialist office to schedule.

## 2024-04-02 ENCOUNTER — Ambulatory Visit: Payer: Self-pay | Admitting: Family Medicine

## 2024-04-02 MED ORDER — LEVOTHYROXINE SODIUM 50 MCG PO TABS: 50.0000 ug | ORAL_TABLET | Freq: Every day | ORAL | 3 refills | Status: AC

## 2024-05-04 ENCOUNTER — Encounter: Payer: Self-pay | Admitting: Podiatry

## 2024-05-04 ENCOUNTER — Ambulatory Visit: Payer: BC Managed Care – PPO | Admitting: Podiatry

## 2024-05-04 DIAGNOSIS — B351 Tinea unguium: Secondary | ICD-10-CM

## 2024-05-04 DIAGNOSIS — E1142 Type 2 diabetes mellitus with diabetic polyneuropathy: Secondary | ICD-10-CM

## 2024-05-04 NOTE — Progress Notes (Signed)
 This patient presents to the office with chief complaint of long thick nails and diabetic feet.  This patient  says there  is  no pain and discomfort in his  feet.  This patient says there are long thick painful nails.  These nails are painful walking and wearing shoes.  Patient has no history of infection or drainage from both feet.  Patient is unable to  self treat his own nails . Patient also has developed a callus under his right foot. This patient presents  to the office today for treatment of the  long nails and a foot evaluation due to history of  diabetes.  General Appearance  Alert, conversant and in no acute stress.  Vascular  Dorsalis pedis and posterior tibial  pulses are palpable  bilaterally.  Capillary return is within normal limits  bilaterally. Temperature is within normal limits  bilaterally.  Neurologic  Senn-Weinstein monofilament wire test markedly diminished bilaterally. Muscle power within normal limits bilaterally.  Nails Thick disfigured discolored nails with subungual debris  from hallux to fifth toes bilaterally. No evidence of bacterial infection or drainage bilaterally.  Orthopedic  No limitations of motion of motion feet .  No crepitus or effusions noted. Hammer toes  B/L.  Skin  normotropic skin with no porokeratosis noted bilaterally.  No signs of infections or ulcers noted.     Onychomycosis  Diabetes with no foot complications  Debride nails x 10.  Debride callus right foot.A diabetic foot exam was performed and there is no evidence of any vascular .  LOPS diminished  B/L.  RTC 3 months.   Cordella Bold DPM

## 2024-05-21 ENCOUNTER — Ambulatory Visit (INDEPENDENT_AMBULATORY_CARE_PROVIDER_SITE_OTHER)

## 2024-05-21 ENCOUNTER — Ambulatory Visit: Admitting: Podiatry

## 2024-05-21 ENCOUNTER — Encounter: Payer: Self-pay | Admitting: Podiatry

## 2024-05-21 VITALS — Ht 71.0 in | Wt 231.0 lb

## 2024-05-21 DIAGNOSIS — L97512 Non-pressure chronic ulcer of other part of right foot with fat layer exposed: Secondary | ICD-10-CM | POA: Diagnosis not present

## 2024-05-21 DIAGNOSIS — E1142 Type 2 diabetes mellitus with diabetic polyneuropathy: Secondary | ICD-10-CM

## 2024-05-21 DIAGNOSIS — E08621 Diabetes mellitus due to underlying condition with foot ulcer: Secondary | ICD-10-CM

## 2024-05-21 MED ORDER — GENTAMICIN SULFATE 0.1 % EX CREA: 1.0000 | TOPICAL_CREAM | Freq: Two times a day (BID) | CUTANEOUS | 1 refills | Status: AC

## 2024-05-21 MED ORDER — DOXYCYCLINE HYCLATE 100 MG PO TABS
100.0000 mg | ORAL_TABLET | Freq: Two times a day (BID) | ORAL | 0 refills | Status: AC
Start: 2024-05-21 — End: ?

## 2024-05-21 NOTE — Progress Notes (Signed)
   Chief Complaint  Patient presents with   Callouses    Pt is here due to callous on the bottom of the right foot, was recently seen here with Dr Loreda, area on the bottom of the foot he is having some discomfort, states the area is hot to touch and has been bleeding.    HPI: 48 y.o. male PMHx diabetes mellitus; uncontrolled, last A1c 03/30/2024 was 10.9, presenting for above complaint  Past Medical History:  Diagnosis Date   Cholestasis (HCC) 12/2018   per ct for kidney stone   Diabetes mellitus without complication (HCC)    Erectile dysfunction 12/02/2015   Hepatic steatosis 12/2018   Extensive hepatic Seatosis per CT completed for kidney stone June 2020   Ingrowing nail, left great toe    Injury of right foot 09/22/2018   Nephrolithiasis 12/2018   2 mm nonobstructive stone   Pain in joint of left shoulder 09/10/2019   Pain in joint of right shoulder 09/10/2019   Visual changes 10/2015   Diabetic changes    Past Surgical History:  Procedure Laterality Date   FOOT SURGERY  1988   mole/nevus   NASAL SEPTUM SURGERY  1999   deviated    No Known Allergies    RT foot 05/21/2024 postdebridement  Physical Exam: General: The patient is alert and oriented x3 in no acute distress.  Dermatology: Ulcer noted to the plantar aspect of the right foot first MTP measuring approximately 2.5 x 2.5 x 0.2 cm.  Fibrotic wound base with intermixed granulation tissue.  There is no exposed bone muscle tendon ligament or joint.  Moderate serosanguineous drainage noted.  Vascular: Palpable pedal pulses bilaterally. Capillary refill within normal limits.  No appreciable edema.  Clinically no concern for vascular compromise  Neurological: Light touch and protective threshold absent  Musculoskeletal Exam: No pedal deformities noted.  No prior amputations  Radiographic Exam RT foot 05/21/2024:  Normal osseous mineralization. Joint spaces preserved.  No fractures or osseous irregularities noted.   Impression: Negative  Assessment/Plan of Care: 1.  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  X-rays reviewed -Medically necessary excisional debridement including subcutaneous tissue was performed today using a tissue nipper.  Excisional debridement of the necrotic nonviable tissue down to healthier bleeding viable tissue was performed with postdebridement measurement same as pre- -Prescription for doxycycline  100 mg twice daily x 10 days -Prescription for gentamicin cream apply twice daily under occlusion with a light dressing -Postop shoe dispensed.  WBAT -Return to clinic 2 weeks       Thresa EMERSON Sar, DPM [facility]  Dr. Thresa EMERSON Sar, DPM    [address] [place], [place] [address]                Office [phone]  Fax [phone]

## 2024-06-04 ENCOUNTER — Encounter: Payer: Self-pay | Admitting: Podiatry

## 2024-06-04 ENCOUNTER — Ambulatory Visit: Admitting: Podiatry

## 2024-06-04 VITALS — Ht 71.0 in | Wt 231.0 lb

## 2024-06-04 DIAGNOSIS — L97512 Non-pressure chronic ulcer of other part of right foot with fat layer exposed: Secondary | ICD-10-CM | POA: Diagnosis not present

## 2024-06-04 DIAGNOSIS — E08621 Diabetes mellitus due to underlying condition with foot ulcer: Secondary | ICD-10-CM

## 2024-06-04 NOTE — Progress Notes (Signed)
   Chief Complaint  Patient presents with   Wound Check    Pt is here to f/u on wound to the left foot, he states that it feels better, no pain a little discomfort, no drainage from the wound.    HPI: 48 y.o. male PMHx diabetes mellitus; uncontrolled, last A1c 03/30/2024 was 10.9, overall improvement  Past Medical History:  Diagnosis Date   Cholestasis (HCC) 12/2018   per ct for kidney stone   Diabetes mellitus without complication (HCC)    Erectile dysfunction 12/02/2015   Hepatic steatosis 12/2018   Extensive hepatic Seatosis per CT completed for kidney stone June 2020   Ingrowing nail, left great toe    Injury of right foot 09/22/2018   Nephrolithiasis 12/2018   2 mm nonobstructive stone   Pain in joint of left shoulder 09/10/2019   Pain in joint of right shoulder 09/10/2019   Visual changes 10/2015   Diabetic changes    Past Surgical History:  Procedure Laterality Date   FOOT SURGERY  1988   mole/nevus   NASAL SEPTUM SURGERY  1999   deviated    No Known Allergies    RT foot 05/21/2024 postdebridement   RT foot 06/04/2024  Physical Exam: General: The patient is alert and oriented x3 in no acute distress.  Dermatology: Significant improvement.  There is no erythema or edema.  The wound is mostly healed and resolved.  There is a very small superficial stable wound to the central portion of the ulcer which should heal uneventfully.  Please see above noted photo  Vascular: Palpable pedal pulses bilaterally. Capillary refill within normal limits.  No appreciable edema.  Clinically no concern for vascular compromise  Neurological: Light touch and protective threshold absent  Musculoskeletal Exam: No pedal deformities noted.  No prior amputations  Radiographic Exam RT foot 05/21/2024:  Normal osseous mineralization. Joint spaces preserved.  No fractures or osseous irregularities noted.  Impression: Negative  Assessment/Plan of Care: 1.  Diabetic ulcer right foot  with fat layer exposed  -Patient evaluated.  -Continue gentamicin cream with a light Band-Aid daily until completely resolved and healed -Okay to discontinue his cam boot.  Resume [employer] orthopedic shoes -Okay to return to work 06/11/2024 -Return to clinic PRN      Thresa EMERSON Sar, DPM [facility]  Dr. Thresa EMERSON Sar, DPM    [address] [place], [place] [address]                Office [phone]  Fax [phone]

## 2024-06-13 ENCOUNTER — Telehealth: Payer: Self-pay | Admitting: Pharmacy Technician

## 2024-06-13 ENCOUNTER — Other Ambulatory Visit (HOSPITAL_COMMUNITY): Payer: Self-pay

## 2024-06-13 NOTE — Telephone Encounter (Signed)
 Pharmacy Patient Advocate Encounter   Received notification from Onbase that prior authorization for Mounjaro  2.5MG /0.5ML auto-injectors  is due for renewal.   Insurance verification completed.   The patient is insured through [facility].  Action: Medication has been discontinued. Archived Key: [id number]

## 2024-07-02 ENCOUNTER — Ambulatory Visit: Admitting: Family Medicine

## 2024-08-03 ENCOUNTER — Ambulatory Visit: Admitting: Family Medicine

## 2024-08-24 ENCOUNTER — Ambulatory Visit: Admitting: Family Medicine

## 2024-09-06 ENCOUNTER — Ambulatory Visit: Admitting: Family Medicine

## 2024-09-11 ENCOUNTER — Ambulatory Visit: Admitting: Family Medicine

## 2024-11-02 ENCOUNTER — Ambulatory Visit: Admitting: Podiatry
